# Patient Record
Sex: Male | Born: 1940 | Race: White | Hispanic: No | Smoking: Never smoker
Health system: Southern US, Community
[De-identification: ages and names within clinical notes are randomized; demographics above are authoritative.]

## PROBLEM LIST (undated history)

## (undated) DIAGNOSIS — K4120 Bilateral femoral hernia, without obstruction or gangrene, not specified as recurrent: Secondary | ICD-10-CM

## (undated) DIAGNOSIS — I714 Abdominal aortic aneurysm, without rupture, unspecified: Secondary | ICD-10-CM

## (undated) DIAGNOSIS — I10 Essential (primary) hypertension: Secondary | ICD-10-CM

## (undated) DIAGNOSIS — E7800 Pure hypercholesterolemia, unspecified: Secondary | ICD-10-CM

## (undated) HISTORY — DX: Essential (primary) hypertension: I10

## (undated) HISTORY — PX: INGUINAL HERNIA REPAIR: SUR1180

## (undated) HISTORY — DX: Bilateral femoral hernia, without obstruction or gangrene, not specified as recurrent: K41.20

## (undated) HISTORY — PX: COLONOSCOPY W/ POLYPECTOMY: SHX1380

## (undated) HISTORY — PX: COLONOSCOPY: SHX174

---

## 2003-09-29 ENCOUNTER — Encounter (INDEPENDENT_AMBULATORY_CARE_PROVIDER_SITE_OTHER): Payer: Self-pay | Admitting: *Deleted

## 2003-09-29 ENCOUNTER — Ambulatory Visit (HOSPITAL_COMMUNITY): Admission: RE | Admit: 2003-09-29 | Discharge: 2003-09-29 | Payer: Self-pay | Admitting: Gastroenterology

## 2006-05-20 ENCOUNTER — Ambulatory Visit: Payer: Self-pay | Admitting: Family Medicine

## 2006-05-21 ENCOUNTER — Ambulatory Visit: Payer: Self-pay | Admitting: Family Medicine

## 2007-03-22 LAB — HM COLONOSCOPY

## 2007-07-19 ENCOUNTER — Ambulatory Visit: Payer: Self-pay | Admitting: Family Medicine

## 2007-07-22 ENCOUNTER — Ambulatory Visit: Payer: Self-pay | Admitting: Family Medicine

## 2007-07-30 ENCOUNTER — Ambulatory Visit: Payer: Self-pay | Admitting: Family Medicine

## 2008-02-02 ENCOUNTER — Ambulatory Visit: Payer: Self-pay | Admitting: Family Medicine

## 2008-05-17 ENCOUNTER — Ambulatory Visit: Payer: Self-pay | Admitting: Family Medicine

## 2009-05-18 ENCOUNTER — Ambulatory Visit: Payer: Self-pay | Admitting: Family Medicine

## 2010-02-28 ENCOUNTER — Ambulatory Visit: Payer: Self-pay | Admitting: Family Medicine

## 2010-09-20 NOTE — Op Note (Signed)
Jacob Bowen, Jacob Bowen                          ACCOUNT NO.:  [id number]   MEDICAL RECORD NO.:  [id number]                   PATIENT TYPE:  AMB   LOCATION:  ENDO                                 FACILITY:  [facility]   PHYSICIAN:  John C. Madilyn Fireman, M.D.                 DATE OF BIRTH:  07/22/1940   DATE OF PROCEDURE:  09/29/2003  DATE OF DISCHARGE:                                 OPERATIVE REPORT   PROCEDURE:  Colonoscopy with polypectomy.   INDICATIONS FOR PROCEDURE:  Polyp seen on sigmoidoscopy.   PROCEDURE:  The patient was placed in the left lateral decubitus position  and placed on the pulse monitor with continuous low flow oxygen delivered by  nasal cannula.  He was sedated with 75 mcg IV fentanyl and 9 mg IV Versed.  The Olympus video colonoscope is inserted into the rectum and advanced to  the cecum, confirmed by transillumination at McBurney's point and  visualization of the ileocecal valve and appendiceal orifice.  Prep is good.  The cecum and ascending colon appeared normal with no masses, polyps,  diverticula, or other mucosal abnormalities.  Within the transverse colon,  there were three widely scattered polyps, 8-10 mm in diameter, and these  were all removed by snare.  In the descending sigmoid colon, there were  three polyps, approximately 6-10 mm in diameter, ranging from 45 cm to 22 cm  from the anal verge.  These were removed by a combination of snare and hot  biopsies.  A few scattered diverticula were seen in the sigmoid and  descending colon.  The rectum appeared normal, and retroflexed view of the  anus revealed no obvious internal hemorrhoids.  The colonoscope was then  withdrawn, and the patient returned to the recovery room in stable  condition.  He tolerated the procedure well, and there were no immediate  complications.   IMPRESSION:  1. Transverse, descending,and sigmoid colon polyps.  2. Mild diverticulosis.   PLAN:  Await histology to determine method in  interval for future colon  screening.                                               John C. Madilyn Fireman, M.D.    [facility]/MEDQ  D:  09/29/2003  T:  09/30/2003  Job:  [id number]   cc:   Sharlot Gowda, M.D.  [address]  [place], [place] [address]  Fax: [phone]

## 2011-01-22 ENCOUNTER — Encounter: Payer: Self-pay | Admitting: Family Medicine

## 2011-04-02 ENCOUNTER — Other Ambulatory Visit: Payer: Self-pay | Admitting: Family Medicine

## 2011-04-02 NOTE — Telephone Encounter (Signed)
Is this okay?

## 2011-04-03 ENCOUNTER — Telehealth: Payer: Self-pay | Admitting: Family Medicine

## 2011-04-03 MED ORDER — DOXAZOSIN MESYLATE 2 MG PO TABS: 2.0000 mg | ORAL_TABLET | Freq: Every day | ORAL | Status: DC

## 2011-04-03 NOTE — Telephone Encounter (Signed)
Has appt next week, #30 only

## 2011-04-03 NOTE — Telephone Encounter (Signed)
Needs an appt

## 2011-04-04 NOTE — Telephone Encounter (Signed)
Have tried twice to call pt to schedule an appt but is not able to reach or leave a message

## 2011-04-07 NOTE — Telephone Encounter (Signed)
Has a appt Friday. Will discuss it then

## 2011-04-11 ENCOUNTER — Encounter: Payer: Self-pay | Admitting: Family Medicine

## 2011-04-11 ENCOUNTER — Ambulatory Visit (INDEPENDENT_AMBULATORY_CARE_PROVIDER_SITE_OTHER): Payer: Medicare Other | Admitting: Family Medicine

## 2011-04-11 VITALS — BP 124/76 | Ht 75.0 in | Wt 202.0 lb

## 2011-04-11 DIAGNOSIS — I10 Essential (primary) hypertension: Secondary | ICD-10-CM

## 2011-04-11 DIAGNOSIS — Z23 Encounter for immunization: Secondary | ICD-10-CM

## 2011-04-11 DIAGNOSIS — K649 Unspecified hemorrhoids: Secondary | ICD-10-CM

## 2011-04-11 DIAGNOSIS — R972 Elevated prostate specific antigen [PSA]: Secondary | ICD-10-CM

## 2011-04-11 DIAGNOSIS — R195 Other fecal abnormalities: Secondary | ICD-10-CM

## 2011-04-11 LAB — COMPREHENSIVE METABOLIC PANEL
ALT: 9 U/L (ref 0–53)
AST: 14 U/L (ref 0–37)
Alkaline Phosphatase: 87 U/L (ref 39–117)
BUN: 10 mg/dL (ref 6–23)
Creat: 0.82 mg/dL (ref 0.50–1.35)
Total Bilirubin: 0.3 mg/dL (ref 0.3–1.2)

## 2011-04-11 LAB — CBC WITH DIFFERENTIAL/PLATELET
Basophils Absolute: 0 10*3/uL (ref 0.0–0.1)
Basophils Relative: 0 % (ref 0–1)
Eosinophils Absolute: 0.2 10*3/uL (ref 0.0–0.7)
Eosinophils Relative: 3 % (ref 0–5)
Lymphocytes Relative: 33 % (ref 12–46)
MCH: 31 pg (ref 26.0–34.0)
MCHC: 33.9 g/dL (ref 30.0–36.0)
MCV: 91.7 fL (ref 78.0–100.0)
Monocytes Absolute: 0.7 10*3/uL (ref 0.1–1.0)
Platelets: 228 10*3/uL (ref 150–400)
RDW: 13.3 % (ref 11.5–15.5)
WBC: 6.7 10*3/uL (ref 4.0–10.5)

## 2011-04-11 LAB — PSA, TOTAL AND FREE: PSA, Free Pct: 15 % — ABNORMAL LOW (ref >25)

## 2011-04-11 MED ORDER — DOXAZOSIN MESYLATE 4 MG PO TABS: 4.0000 mg | ORAL_TABLET | ORAL | Status: DC

## 2011-04-11 MED ORDER — LISINOPRIL-HYDROCHLOROTHIAZIDE 10-12.5 MG PO TABS: 1.0000 | ORAL_TABLET | Freq: Every day | ORAL | Status: DC

## 2011-04-11 NOTE — Patient Instructions (Signed)
Recheck in one month for recheck on blood pressure

## 2011-04-11 NOTE — Progress Notes (Signed)
  Subjective:    Patient ID: Jacob Bowen, male    DOB: 1940-12-25, 70 y.o.   MRN: [id number]  HPI He is here for an interval evaluation. He is getting ready to switch to for plans next year and would like to be switched to a different blood pressure medication it would be less expensive. He also needs followup on his elevated PSA. He has a history of hemorrhoids as well as adenomatous colonic polyps. He is now retired and enjoying his retirement.   Review of Systems  Constitutional: Negative.   HENT: Negative.   Eyes: Negative.   Respiratory: Negative.   Gastrointestinal: Negative.   Skin: Negative.   Psychiatric/Behavioral: Negative.        Objective:   Physical Exam BP 124/76  Ht 6\' 3"  (1.905 m)  Wt 202 lb (91.627 kg)  BMI 25.25 kg/m2  General Appearance:    Alert, cooperative, no distress, appears stated age  Head:    Normocephalic, without obvious abnormality, atraumatic  Eyes:    PERRL, conjunctiva/corneas clear, EOM's intact, fundi    benign  Ears:    Normal TM's and external ear canals  Nose:   Nares normal, mucosa normal, no drainage or sinus   tenderness  Throat:   Lips, mucosa, and tongue normal; teeth and gums normal  Neck:   Supple, no lymphadenopathy;  thyroid:  no   enlargement/tenderness/nodules; no carotid   bruit or JVD  Back:    Spine nontender, no curvature, ROM normal, no CVA     tenderness  Lungs:     Clear to auscultation bilaterally without wheezes, rales or     ronchi; respirations unlabored  Chest Wall:    No tenderness or deformity   Heart:    Regular rate and rhythm, S1 and S2 normal, no murmur, rub   or gallop  Breast Exam:    No chest wall tenderness, masses or gynecomastia  Abdomen:     Soft, non-tender, nondistended, normoactive bowel sounds,    no masses, no hepatosplenomegaly  Genitalia:    Normal male external genitalia without lesions.  Testicles without masses.  No inguinal hernias.  Rectal:    Normal sphincter tone, external  hemorrhoids noted; guaiac positive stool.  Prostate smooth, no nodules, not enlarged.  Extremities:   No clubbing, cyanosis or edema  Pulses:   2+ and symmetric all extremities  Skin:   Skin color, texture, turgor normal, no rashes or lesions  Lymph nodes:   Cervical, supraclavicular, and axillary nodes normal  Neurologic:   CNII-XII intact, normal strength, sensation and gait; reflexes 2+ and symmetric throughout          Psych:   Normal mood, affect, hygiene and grooming.           Assessment & Plan:   1. Elevated PSA  PSA, total and free  2. Hypertension  CBC with Differential, Comprehensive metabolic panel  3. Hemorrhoids  Ambulatory referral to Gastroenterology  4. Guaiac positive stools  Ambulatory referral to Gastroenterology, CBC with Differential, Comprehensive metabolic panel, POCT Hemoccult (POC) Blood/Stool Test   I again discussed his elevated PSA in regard to risk for cancer and appropriate followup. He is comfortable with monitoring as we have been. Will also switch him to different antihypertensive medication and recheck this in one month. Flu Shot given with discussion of possible side effects.

## 2011-05-12 ENCOUNTER — Encounter: Payer: Self-pay | Admitting: Family Medicine

## 2011-05-12 ENCOUNTER — Ambulatory Visit (INDEPENDENT_AMBULATORY_CARE_PROVIDER_SITE_OTHER): Payer: Medicare Other | Admitting: Family Medicine

## 2011-05-12 VITALS — BP 128/72 | HR 82 | Ht 74.0 in | Wt 196.0 lb

## 2011-05-12 DIAGNOSIS — I10 Essential (primary) hypertension: Secondary | ICD-10-CM

## 2011-05-12 NOTE — Progress Notes (Signed)
  Subjective:    Patient ID: Jacob Bowen, male    DOB: 1941/03/23, 71 y.o.   MRN: [id number]  HPI He is here for a recheck on his blood pressure. On his last visit he was switched to a different hypertensive medication. He is having no difficulty with areas since his last visit he has also had cataract surgery and notes a great improvement in his visual acuity.   Review of Systems     Objective:   Physical Exam Alert and in no distress. Blood pressure is recorded       Assessment & Plan:  Hypertension. Continue present medication regimen.

## 2011-05-12 NOTE — Patient Instructions (Signed)
Stay on the present meds

## 2011-05-29 ENCOUNTER — Other Ambulatory Visit: Payer: Self-pay | Admitting: Family Medicine

## 2011-11-21 ENCOUNTER — Ambulatory Visit (INDEPENDENT_AMBULATORY_CARE_PROVIDER_SITE_OTHER): Payer: Medicare Other | Admitting: Family Medicine

## 2011-11-21 ENCOUNTER — Telehealth: Payer: Self-pay

## 2011-11-21 ENCOUNTER — Encounter: Payer: Self-pay | Admitting: Family Medicine

## 2011-11-21 VITALS — BP 124/70 | HR 91 | Wt 189.0 lb

## 2011-11-21 DIAGNOSIS — K4090 Unilateral inguinal hernia, without obstruction or gangrene, not specified as recurrent: Secondary | ICD-10-CM

## 2011-11-21 NOTE — Progress Notes (Signed)
  Subjective:    Patient ID: Jacob Bowen, male    DOB: 1940/12/13, 71 y.o.   MRN: [id number]  HPI Several months ago while lifting something he felt a pain and swelling in the right inguinal area. He is here for evaluation.   Review of Systems     Objective:   Physical Exam Exam of the right inguinal area does show a rather large hernia present.       Assessment & Plan:   1. Right inguinal hernia  Ambulatory referral to General Surgery

## 2011-11-21 NOTE — Telephone Encounter (Signed)
Pt informed of appointment Aug,20 @ 9;45 [place] [facility] Dr.Gross [phone]

## 2011-12-23 ENCOUNTER — Ambulatory Visit (INDEPENDENT_AMBULATORY_CARE_PROVIDER_SITE_OTHER): Payer: Medicare Other | Admitting: Surgery

## 2011-12-23 ENCOUNTER — Encounter (INDEPENDENT_AMBULATORY_CARE_PROVIDER_SITE_OTHER): Payer: Self-pay | Admitting: Surgery

## 2011-12-23 VITALS — BP 128/70 | HR 92 | Temp 97.8°F | Resp 16 | Ht 75.0 in | Wt 191.0 lb

## 2011-12-23 DIAGNOSIS — K4090 Unilateral inguinal hernia, without obstruction or gangrene, not specified as recurrent: Secondary | ICD-10-CM

## 2011-12-23 DIAGNOSIS — K429 Umbilical hernia without obstruction or gangrene: Secondary | ICD-10-CM

## 2011-12-23 NOTE — Progress Notes (Signed)
Subjective:     Patient ID: Jacob Bowen, male   DOB: Mar 19, 1941, 71 y.o.   MRN: [id number]  HPI  Jacob Bowen  1941-02-21 [id number]  Patient Care Team: Ronnald Nian, MD as PCP - General (Family Medicine)  This patient is a 71 y.o.male who presents today for surgical evaluation at the request of Dr. Susann Givens.   Reason for evaluation: Right groin swelling and probable hernia.  Pleasant active male..  Busy working on cars and racing.  Noted in February a lump after working in the garage.  It is gotten larger.  He was concerned.  He saw his primary care physician who recommended consider of surgical repair for probable hernia.  He's usually rather active.  No prior history of any surgeries.  No skin problems.  He also notes that when he bends over he feels, pain and discomfort at times along his bilateral subcostal ridges.  Not worse with activity.  No radiation to his jaw nor arm.  No diaphoesis nor sweating.  Not associated with heavy activities except when he bends over.  No heartburn or reflux.  No belching.  He wondered what that meant.  Patient Active Problem List  Diagnosis  . Elevated PSA  . Hypertension  . Hemorrhoids  . Inguinal hernia, right, scrotal  . Umbilical hernia, 7mm  . Left groin swelling, possible hernia  . Costochondral chest pain, mild    Past Medical History  Diagnosis Date  . Hypertension   . Hemorrhoids     EXTERNAL    Past Surgical History  Procedure Date  . Colonoscopy   . Colonoscopy w/ polypectomy     History   Social History  . Marital Status: Divorced    Spouse Name: N/A    Number of Children: N/A  . Years of Education: N/A   Occupational History  . Not on file.   Social History Main Topics  . Smoking status: Never Smoker   . Smokeless tobacco: Never Used  . Alcohol Use: No  . Drug Use: No  . Sexually Active: Not on file   Other Topics Concern  . Not on file   Social History Narrative  . No narrative on file     Family History  Problem Relation Age of Onset  . Hypertension Father     Current Outpatient Prescriptions  Medication Sig Dispense Refill  . doxazosin (CARDURA) 4 MG tablet TAKE 1 TABLET EVERY DAY  30 tablet  5  . lisinopril-hydrochlorothiazide (ZESTORETIC) 10-12.5 MG per tablet Take 1 tablet by mouth daily.  90 tablet  3     No Known Allergies  BP 128/70  Pulse 92  Temp 97.8 F (36.6 C) (Temporal)  Resp 16  Ht 6\' 3"  (1.905 m)  Wt 191 lb (86.637 kg)  BMI 23.87 kg/m2  No results found.    Review of Systems  Constitutional: Negative for fever, chills and diaphoresis.  HENT: Negative for nosebleeds, sore throat, facial swelling, mouth sores, trouble swallowing and ear discharge.   Eyes: Negative for photophobia, discharge and visual disturbance.  Respiratory: Negative for choking, chest tightness, shortness of breath and stridor.   Cardiovascular: Positive for chest pain. Negative for palpitations.       Patient walks 3 miles and bicycles 10 miles without difficulty.  No exertional chest/neck/shoulder/arm pain.  Mild subcostal chest wall soreness with leaning forward   Gastrointestinal: Negative for nausea, vomiting, abdominal pain, diarrhea, constipation, blood in stool, abdominal distention, anal bleeding and rectal pain.  Genitourinary: Positive for difficulty urinating. Negative for dysuria, urgency, flank pain, enuresis and testicular pain.       BPH controlled w cardura  Musculoskeletal: Negative for myalgias, back pain, arthralgias and gait problem.  Skin: Negative for color change, pallor, rash and wound.  Neurological: Negative for dizziness, speech difficulty, weakness, numbness and headaches.  Hematological: Negative for adenopathy. Does not bruise/bleed easily.  Psychiatric/Behavioral: Negative for hallucinations, confusion and agitation.       Objective:   Physical Exam  Constitutional: He is oriented to person, place, and time. He appears  well-developed and well-nourished. No distress.  HENT:  Head: Normocephalic.  Mouth/Throat: Oropharynx is clear and moist. No oropharyngeal exudate.  Eyes: Conjunctivae and EOM are normal. Pupils are equal, round, and reactive to light. No scleral icterus.  Neck: Normal range of motion. Neck supple. No tracheal deviation present.  Cardiovascular: Normal rate, regular rhythm and intact distal pulses.   Pulmonary/Chest: Effort normal and breath sounds normal. No respiratory distress.  Abdominal: Soft. He exhibits no distension. There is no tenderness. A hernia is present. Hernia confirmed positive in the right inguinal area.  Genitourinary:     Musculoskeletal: Normal range of motion. He exhibits no tenderness.  Lymphadenopathy:    He has no cervical adenopathy.       Right: No inguinal adenopathy present.       Left: No inguinal adenopathy present.  Neurological: He is alert and oriented to person, place, and time. No cranial nerve deficit. He exhibits normal muscle tone. Coordination normal.  Skin: Skin is warm and dry. No rash noted. He is not diaphoretic. No erythema. No pallor.  Psychiatric: He has a normal mood and affect. His behavior is normal. Judgment and thought content normal. His mood appears not anxious. His affect is not labile. His speech is not rapid and/or pressured and not slurred.       Pleasant.  Very chatty & self-revealatory       Assessment:     RIH down to proximal scrotum with prob small LIH.  Small umb hernia  Probable mild costochondral irritation with activity.    Plan:     Lap RIH & prob LIH repairs.  Primary umb hernia repair:  The anatomy & physiology of the abdominal wall and pelvic floor was discussed.  The pathophysiology of hernias in the inguinal and pelvic region was discussed.  Natural history risks such as progressive enlargement, pain, incarceration & strangulation was discussed.   Contributors to complications such as smoking, obesity,  diabetes, prior surgery, etc were discussed.    I feel the risks of no intervention will lead to serious problems that outweigh the operative risks; therefore, I recommended surgery to reduce and repair the hernia.  I explained laparoscopic techniques with possible need for an open approach.  I noted usual use of mesh to patch and/or buttress hernia repair  Risks such as bleeding, infection, abscess, need for further treatment, heart attack, death, and other risks were discussed.  I noted a good likelihood this will help address the problem.   Goals of post-operative recovery were discussed as well.  Possibility that this will not correct all symptoms was explained.  I stressed the importance of low-impact activity, aggressive pain control, avoiding constipation, & not pushing through pain to minimize risk of post-operative chronic pain or injury. Possibility of reherniation was discussed.  We will work to minimize complications.     An educational handout further explaining the pathology & treatment options was given  as well.  Questions were answered.  The patient expresses understanding & wishes to proceed with surgery.  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  He is hoping to have surgery in early November when he returns.  Consider heat and anti-inflammatories for his lower chest wall / costochondral discomfort.  I suspect it resolve over the next month or so.  It is already happening less on its own

## 2011-12-23 NOTE — Patient Instructions (Addendum)
See the Handout(s) we gave you.  Consider surgery.  Please call our office at [phone] if you wish to schedule surgery or if you have further questions / concerns.    Hernia A hernia occurs when an internal organ pushes out through a weak spot in the abdominal wall. Hernias most commonly occur in the groin and around the navel. Hernias often can be pushed back into place (reduced). Most hernias tend to get worse over time. Some abdominal hernias can get stuck in the opening (irreducible or incarcerated hernia) and cannot be reduced. An irreducible abdominal hernia which is tightly squeezed into the opening is at risk for impaired blood supply (strangulated hernia). A strangulated hernia is a medical emergency. Because of the risk for an irreducible or strangulated hernia, surgery may be recommended to repair a hernia. CAUSES   Heavy lifting.   Prolonged coughing.   Straining to have a bowel movement.   A cut (incision) made during an abdominal surgery.  HOME CARE INSTRUCTIONS   Bed rest is not required. You may continue your normal activities.   Avoid lifting more than 10 pounds (4.5 kg) or straining.   Cough gently. If you are a smoker it is best to stop. Even the best hernia repair can break down with the continual strain of coughing. Even if you do not have your hernia repaired, a cough will continue to aggravate the problem.   Do not wear anything tight over your hernia. Do not try to keep it in with an outside bandage or truss. These can damage abdominal contents if they are trapped within the hernia sac.   Eat a normal diet.   Avoid constipation. Straining over long periods of time will increase hernia size and encourage breakdown of repairs. If you cannot do this with diet alone, stool softeners may be used.  SEEK IMMEDIATE MEDICAL CARE IF:   You have a fever.   You develop increasing abdominal pain.   You feel nauseous or vomit.   Your hernia is stuck outside the  abdomen, looks discolored, feels hard, or is tender.   You have any changes in your bowel habits or in the hernia that are unusual for you.   You have increased pain or swelling around the hernia.   You cannot push the hernia back in place by applying gentle pressure while lying down.  MAKE SURE YOU:   Understand these instructions.   Will watch your condition.   Will get help right away if you are not doing well or get worse.  Document Released: 04/21/2005 Document Revised: 04/10/2011 Document Reviewed: 12/09/2007 [employer] Patient Information 2012 [employer]. 

## 2012-01-27 ENCOUNTER — Encounter: Payer: Self-pay | Admitting: Medical

## 2012-01-27 ENCOUNTER — Ambulatory Visit (INDEPENDENT_AMBULATORY_CARE_PROVIDER_SITE_OTHER): Payer: Medicare Other | Admitting: Medical

## 2012-01-27 VITALS — BP 112/70 | HR 100 | Temp 98.1°F | Resp 16 | Wt 186.0 lb

## 2012-01-27 DIAGNOSIS — S62609B Fracture of unspecified phalanx of unspecified finger, initial encounter for open fracture: Secondary | ICD-10-CM

## 2012-01-27 DIAGNOSIS — S6710XA Crushing injury of unspecified finger(s), initial encounter: Secondary | ICD-10-CM

## 2012-01-27 DIAGNOSIS — IMO0002 Reserved for concepts with insufficient information to code with codable children: Secondary | ICD-10-CM

## 2012-01-27 DIAGNOSIS — IMO0001 Reserved for inherently not codable concepts without codable children: Secondary | ICD-10-CM

## 2012-01-27 NOTE — Progress Notes (Signed)
Subjective: Here for emergency dept follow up.  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  They were attempting to move his race car when his hand got caught in the tow strap crushing his right middle and ring finger.  The middle finger tip pad was torn off and lying in his race glove, and there were open wounds.  He went to the ED where orthopedic surgeon did repair of the fingers under digital block local anesthesia.  He apparently was able to close the ringer finger wound, but ended up pulling skin flap up from lower on middle finger to repair amputated tip of the middle right finger.  He was given pain medication and antibiotics in the ED, and was discharged with hand wrapped in bandages.   He was advised to f/u with PCM and ortho within a week, but to change dressing if bloody or soiled drainage.   He is here today as the bandage is becoming quite soiled with bloody drainage from the wound.  He has not changed the dressing since leaving the ED.    He currently denies fever, nausea, numbness, tingling, weakness.  He wants ortho referral to possibly save the fingernail or as much of the right middle finger tip as possible.  He also needs the bandage changed.  No other c/o.   Past Medical History  Diagnosis Date  . Hypertension   . Hemorrhoids     EXTERNAL   ROS as noted above in HPI  Objective: Gen: wd, wn MSK: right hand and forearm wrapped in ACE wrap and bandaged up with obvious metal finger splint over right 4th finger, and there is bloody soiling of the bandage at distal middle finger.  Skin: warm  Assessment: Encounter Diagnoses  Name Primary?  . Crushing injury of finger Yes  . Amputation of finger tip   . Open fracture of phalanx of fourth finger of right hand     Plan: I reviewed the ED records that were provided by the patient including xray.  Given the need for bandage change and possibility of additional procedure to salvage  distal middle finger, [facility] for further evaluation and management.

## 2012-03-18 DIAGNOSIS — K4020 Bilateral inguinal hernia, without obstruction or gangrene, not specified as recurrent: Secondary | ICD-10-CM

## 2012-03-18 DIAGNOSIS — Q828 Other specified congenital malformations of skin: Secondary | ICD-10-CM

## 2012-04-07 ENCOUNTER — Telehealth (INDEPENDENT_AMBULATORY_CARE_PROVIDER_SITE_OTHER): Payer: Self-pay | Admitting: General Surgery

## 2012-04-07 ENCOUNTER — Encounter (INDEPENDENT_AMBULATORY_CARE_PROVIDER_SITE_OTHER): Payer: Medicare Other | Admitting: Surgery

## 2012-04-07 NOTE — Telephone Encounter (Signed)
Tried leaving a message to let pt know hat I am having to reschedule his appt to 12/10 at 3:00 due to Dr. Michaell Cowing not being able to make I to the clinic today.  The patient had no voicemail option available so I sent his new appt time information in the mail.

## 2012-04-13 ENCOUNTER — Encounter (INDEPENDENT_AMBULATORY_CARE_PROVIDER_SITE_OTHER): Payer: Self-pay | Admitting: Surgery

## 2012-04-13 ENCOUNTER — Ambulatory Visit (INDEPENDENT_AMBULATORY_CARE_PROVIDER_SITE_OTHER): Payer: Medicare Other | Admitting: Surgery

## 2012-04-13 VITALS — BP 140/72 | HR 88 | Temp 96.8°F | Resp 18 | Ht 75.0 in | Wt 192.0 lb

## 2012-04-13 DIAGNOSIS — K4090 Unilateral inguinal hernia, without obstruction or gangrene, not specified as recurrent: Secondary | ICD-10-CM

## 2012-04-13 DIAGNOSIS — K429 Umbilical hernia without obstruction or gangrene: Secondary | ICD-10-CM

## 2012-04-13 DIAGNOSIS — K4120 Bilateral femoral hernia, without obstruction or gangrene, not specified as recurrent: Secondary | ICD-10-CM

## 2012-04-13 HISTORY — DX: Bilateral femoral hernia, without obstruction or gangrene, not specified as recurrent: K41.20

## 2012-04-13 NOTE — Progress Notes (Signed)
Subjective:     Patient ID: Jacob Bowen, male   DOB: [birth year], 71 y.o.   MRN: [id number]  HPI  DACE DENN  November 03, 1940 [id number]  Patient Care Team: Ronnald Nian, MD as PCP - General (Family Medicine)  This patient is a 71 y.o.male who presents today for surgical evaluation.   Is an active male with inguinal hernia direct scrotum probable left inguinal hernia.  I took him to the operating room.  He actually had bilateral femoral hernia as well.  All repair with mesh.  Small umbilical hernia primarily repaired.  Use ice packs for two days and then stop.  Start moving his bowels postop day three.  He has done well.  Walking well.  Hoping to back to cycling more aggressively.  No fevers or chills.  Never needed narcotics.  Patient Active Problem List  Diagnosis  . Elevated PSA  . Hypertension  . Hemorrhoids    Past Medical History  Diagnosis Date  . Hypertension   . Hemorrhoids     EXTERNAL  . Femoral hernia, bilateral, s/p lap repair 04/13/2012    Past Surgical History  Procedure Date  . Colonoscopy   . Colonoscopy w/ polypectomy     History   Social History  . Marital Status: Divorced    Spouse Name: N/A    Number of Children: N/A  . Years of Education: N/A   Occupational History  . Not on file.   Social History Main Topics  . Smoking status: Never Smoker   . Smokeless tobacco: Never Used  . Alcohol Use: No  . Drug Use: No  . Sexually Active: Not on file   Other Topics Concern  . 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 Hypertension Father     Current Outpatient Prescriptions  Medication Sig Dispense Refill  . doxazosin (CARDURA) 4 MG tablet TAKE 1 TABLET EVERY DAY  30 tablet  5  . lisinopril-hydrochlorothiazide (PRINZIDE,ZESTORETIC) 10-12.5 MG per tablet Take 1 tablet by mouth daily.        Marland Kitchen HYDROcodone-acetaminophen (NORCO) 7.5-325 MG per tablet Take 1 tablet by mouth every 6 (six) hours as needed.      Marland Kitchen lisinopril-hydrochlorothiazide (ZESTORETIC) 10-12.5 MG per tablet Take 1 tablet by mouth daily.  90 tablet  3     No Known Allergies  BP 140/72  Pulse 88  Temp 96.8 F (36 C) (Temporal)  Resp 18  Ht 6\' 3"  (1.905 m)  Wt 192 lb (87.091 kg)  BMI 24.00 kg/m2  No results found.   Review of Systems  Constitutional: Negative for fever, chills and diaphoresis.  HENT: Negative for sore throat, trouble swallowing and neck pain.   Eyes: Negative for photophobia and visual disturbance.  Respiratory: Negative for choking and shortness of breath.   Cardiovascular: Negative for chest pain and palpitations.  Gastrointestinal: Negative for nausea, vomiting, abdominal distention, anal bleeding and rectal pain.  Genitourinary: Negative for dysuria, urgency, difficulty urinating and testicular pain.  Musculoskeletal: Negative for myalgias, arthralgias and gait problem.  Skin: Negative for color change and rash.  Neurological: Negative for dizziness, speech difficulty, weakness and numbness.  Hematological: Negative for adenopathy.  Psychiatric/Behavioral: Negative for hallucinations, confusion and agitation.       Objective:   Physical Exam  Constitutional: He  is oriented to person, place, and time. He appears well-developed and well-nourished. No distress.  HENT:  Head: Normocephalic.  Mouth/Throat: Oropharynx is clear and moist. No oropharyngeal exudate.  Eyes: Conjunctivae normal and EOM are normal. Pupils are equal, round, and reactive to light. No scleral icterus.  Neck: Normal range of motion. No tracheal deviation present.  Cardiovascular: Normal rate, normal heart sounds and intact distal pulses.   Pulmonary/Chest: Effort normal. No respiratory distress.  Abdominal: Soft. He exhibits no distension. There is no tenderness. No hernia. Hernia confirmed negative in the  right inguinal area and confirmed negative in the left inguinal area.         Incisions clean with normal healing ridges.  No hernias  Musculoskeletal: Normal range of motion. He exhibits no tenderness.  Neurological: He is alert and oriented to person, place, and time. No cranial nerve deficit. He exhibits normal muscle tone. Coordination normal.  Skin: Skin is warm and dry. No rash noted. He is not diaphoretic.  Psychiatric: He has a normal mood and affect. His behavior is normal.       Assessment:     1 month Status post-laparoscopic repair of bilateral inguinal and femoral hernias and umbilical hernias.  Recovering remarkably well.    Plan:     Increase activity as tolerated to regular activity.  Do not push through pain.  Okay to do more aggressive activities such as bike riding  Diet as tolerated. Bowel regimen to avoid problems.  Return to clinic p.r.n.   Instructions discussed.  Followup with primary care physician for other health issues as would normally be done.  Questions answered.  The patient expressed understanding and appreciation

## 2012-04-13 NOTE — Patient Instructions (Signed)
HERNIA REPAIR: POST OP INSTRUCTIONS  1. DIET: Follow a light bland diet the first 24 hours after arrival home, such as soup, liquids, crackers, etc.  Be sure to include lots of fluids daily.  Avoid fast food or heavy meals as your are more likely to get nauseated.  Eat a low fat the next few days after surgery. 2. Take your usually prescribed home medications unless otherwise directed. 3. PAIN CONTROL: a. Pain is best controlled by a usual combination of three different methods TOGETHER: i. Ice/Heat ii. Over the counter pain medication iii. Prescription pain medication b. Most patients will experience some swelling and bruising around the hernia(s) such as the bellybutton, groins, or old incisions.  Ice packs or heating pads (30-60 minutes up to 6 times a day) will help. Use ice for the first few days to help decrease swelling and bruising, then switch to heat to help relax tight/sore spots and speed recovery.  Some people prefer to use ice alone, heat alone, alternating between ice & heat.  Experiment to what works for you.  Swelling and bruising can take several weeks to resolve.   c. It is helpful to take an over-the-counter pain medication regularly for the first few weeks.  Choose one of the following that works best for you: i. Naproxen (Aleve, etc)  Two 220mg tabs twice a day ii. Ibuprofen (Advil, etc) Three 200mg tabs four times a day (every meal & bedtime) iii. Acetaminophen (Tylenol, etc) 325-650mg four times a day (every meal & bedtime) d. A  prescription for pain medication should be given to you upon discharge.  Take your pain medication as prescribed.  i. If you are having problems/concerns with the prescription medicine (does not control pain, nausea, vomiting, rash, itching, etc), please call us [phone] to see if we need to switch you to a different pain medicine that will work better for you and/or control your side effect better. ii. If you need a refill on your pain  medication, please contact your pharmacy.  They will contact our office to request authorization. Prescriptions will not be filled after 5 pm or on week-ends. 4. Avoid getting constipated.  Between the surgery and the pain medications, it is common to experience some constipation.  Increasing fluid intake and taking a fiber supplement (such as Metamucil, Citrucel, FiberCon, MiraLax, etc) 1-2 times a day regularly will usually help prevent this problem from occurring.  A mild laxative (prune juice, Milk of Magnesia, MiraLax, etc) should be taken according to package directions if there are no bowel movements after 48 hours.   5. Wash / shower every day.  You may shower over the dressings as they are waterproof.   6. Remove your waterproof bandages 5 days after surgery.  You may leave the incision open to air.  You may replace a dressing/Band-Aid to cover the incision for comfort if you wish.  Continue to shower over incision(s) after the dressing is off.    7. ACTIVITIES as tolerated:   a. You may resume regular (light) daily activities beginning the next day-such as daily self-care, walking, climbing stairs-gradually increasing activities as tolerated.  If you can walk 30 minutes without difficulty, it is safe to try more intense activity such as jogging, treadmill, bicycling, low-impact aerobics, swimming, etc. b. Save the most intensive and strenuous activity for last such as sit-ups, heavy lifting, contact sports, etc  Refrain from any heavy lifting or straining until you are off narcotics for pain control.     c. DO NOT PUSH THROUGH PAIN.  Let pain be your guide: If it hurts to do something, don't do it.  Pain is your body warning you to avoid that activity for another week until the pain goes down. d. You may drive when you are no longer taking prescription pain medication, you can comfortably wear a seatbelt, and you can safely maneuver your car and apply brakes. e. You may have sexual intercourse  when it is comfortable.  8. FOLLOW UP in our office a. Please call [facility] at [phone] to set up an appointment to see your surgeon in the office for a follow-up appointment approximately 2-3 weeks after your surgery. b. Make sure that you call for this appointment the day you arrive home to insure a convenient appointment time. 9.  IF YOU HAVE DISABILITY OR FAMILY LEAVE FORMS, BRING THEM TO THE OFFICE FOR PROCESSING.  DO NOT GIVE THEM TO YOUR DOCTOR.  WHEN TO CALL US [phone]: 1. Poor pain control 2. Reactions / problems with new medications (rash/itching, nausea, etc)  3. Fever over 101.5 F (38.5 C) 4. Inability to urinate 5. Nausea and/or vomiting 6. Worsening swelling or bruising 7. Continued bleeding from incision. 8. Increased pain, redness, or drainage from the incision   The clinic staff is available to answer your questions during regular business hours (8:30am-5pm).  Please don't hesitate to call and ask to speak to one of our nurses for clinical concerns.   If you have a medical emergency, go to the nearest emergency room or call 911.  A surgeon from [facility] is always on call at the hospitals in [place]  [facility] [address], [address], [place], [place]  [address] ?  [address] [address], [place], [place]   [address] MAIN: [phone] ? TOLL FREE: [phone] ? FAX: [phone] [email or web address]  

## 2012-04-14 ENCOUNTER — Other Ambulatory Visit: Payer: Self-pay | Admitting: Family Medicine

## 2012-05-03 ENCOUNTER — Other Ambulatory Visit: Payer: Self-pay | Admitting: Family Medicine

## 2012-05-03 NOTE — Telephone Encounter (Signed)
Patient needs to schedule a followup appointment.

## 2012-07-15 ENCOUNTER — Other Ambulatory Visit: Payer: Self-pay | Admitting: Family Medicine

## 2012-08-03 ENCOUNTER — Other Ambulatory Visit: Payer: Self-pay | Admitting: Family Medicine

## 2012-10-13 ENCOUNTER — Other Ambulatory Visit: Payer: Self-pay | Admitting: Family Medicine

## 2012-11-06 ENCOUNTER — Other Ambulatory Visit: Payer: Self-pay | Admitting: Family Medicine

## 2012-11-08 ENCOUNTER — Other Ambulatory Visit: Payer: Self-pay | Admitting: Family Medicine

## 2012-11-09 ENCOUNTER — Encounter: Payer: Self-pay | Admitting: Family Medicine

## 2012-11-09 ENCOUNTER — Ambulatory Visit (INDEPENDENT_AMBULATORY_CARE_PROVIDER_SITE_OTHER): Payer: Medicare Other | Admitting: Family Medicine

## 2012-11-09 VITALS — BP 122/64 | HR 114 | Ht 73.0 in | Wt 179.0 lb

## 2012-11-09 DIAGNOSIS — R1900 Intra-abdominal and pelvic swelling, mass and lump, unspecified site: Secondary | ICD-10-CM

## 2012-11-09 DIAGNOSIS — R972 Elevated prostate specific antigen [PSA]: Secondary | ICD-10-CM

## 2012-11-09 DIAGNOSIS — R000 Tachycardia, unspecified: Secondary | ICD-10-CM

## 2012-11-09 DIAGNOSIS — I10 Essential (primary) hypertension: Secondary | ICD-10-CM

## 2012-11-09 DIAGNOSIS — Z0000 Encounter for general adult medical examination without abnormal findings: Secondary | ICD-10-CM

## 2012-11-09 LAB — CBC WITH DIFFERENTIAL/PLATELET
Basophils Absolute: 0 10*3/uL (ref 0.0–0.1)
Eosinophils Relative: 2 % (ref 0–5)
Lymphocytes Relative: 26 % (ref 12–46)
Neutro Abs: 6 10*3/uL (ref 1.7–7.7)
Platelets: 239 10*3/uL (ref 150–400)
RDW: 13.8 % (ref 11.5–15.5)
WBC: 9.6 10*3/uL (ref 4.0–10.5)

## 2012-11-09 LAB — COMPREHENSIVE METABOLIC PANEL
ALT: 9 U/L (ref 0–53)
AST: 14 U/L (ref 0–37)
Alkaline Phosphatase: 88 U/L (ref 39–117)
Calcium: 9.8 mg/dL (ref 8.4–10.5)
Chloride: 101 mEq/L (ref 96–112)
Creat: 0.96 mg/dL (ref 0.50–1.35)
Potassium: 3.9 mEq/L (ref 3.5–5.3)

## 2012-11-09 LAB — POCT URINALYSIS DIPSTICK
Leukocytes, UA: NEGATIVE
Urobilinogen, UA: NEGATIVE

## 2012-11-09 LAB — LIPID PANEL
LDL Cholesterol: 150 mg/dL — ABNORMAL HIGH (ref 0–99)
Total CHOL/HDL Ratio: 5 Ratio

## 2012-11-09 LAB — HEMOCCULT GUIAC POC 1CARD (OFFICE)

## 2012-11-09 MED ORDER — DOXAZOSIN MESYLATE 4 MG PO TABS: ORAL_TABLET | ORAL | Status: DC

## 2012-11-09 MED ORDER — LISINOPRIL-HYDROCHLOROTHIAZIDE 10-12.5 MG PO TABS: ORAL_TABLET | ORAL | Status: DC

## 2012-11-09 NOTE — Progress Notes (Signed)
  Subjective:    Patient ID: Jacob Bowen, male    DOB: May 16, 1940, 72 y.o.   MRN: [id number]  HPI He is here for complete examination. He has no particular complaints or concerns. He did have an injury to his right hand third finger. 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 This seems to be healing well. Review of the record indicates he did have an elevated PSA in the past. He continues on his blood pressure medications and is not having any difficulty with them.social and family history were reviewed. 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  Review of Systems Negative except as above    Objective:   Physical Exam BP 122/64  Pulse 114  Ht 6\' 1"  (1.854 m)  Wt 179 lb (81.194 kg)  BMI 23.62 kg/m2  General Appearance:    Alert, cooperative, no distress, appears stated age  Head:    Normocephalic, without obvious abnormality, atraumatic  Eyes:    PERRL, conjunctiva/corneas clear, EOM's intact, fundi    benign  Ears:    Normal TM's and external ear canals  Nose:   Nares normal, mucosa normal, no drainage or sinus   tenderness  Throat:   Lips, mucosa, and tongue normal; teeth and gums normal  Neck:   Supple, no lymphadenopathy;  thyroid:  no   enlargement/tenderness/nodules; no carotid   bruit or JVD  Back:    Spine nontender, no curvature, ROM normal, no CVA     tenderness  Lungs:     Clear to auscultation bilaterally without wheezes, rales or     ronchi; respirations unlabored  Chest Wall:    No tenderness or deformity   Heart:    tachycardia noted, S1 and S2 normal, no murmur, rub   or gallop  Breast Exam:    No chest wall tenderness, masses or gynecomastia  Abdomen:     Soft, non-tender, nondistended, normoactive bowel sounds,    Pulsatile mass noted, no hepatosplenomegaly  Genitalia:  deferred  Rectal:    Normal sphincter tone, no masses or tenderness; guaiac negative  stool.  Prostate smooth, no nodules, not enlarged.  Extremities:   No clubbing, cyanosis or edema  Pulses:   2+ and symmetric all extremities  Skin:   Skin color, texture, turgor normal, no rashes or lesions  Lymph nodes:   Cervical, supraclavicular, and axillary nodes normal  Neurologic:   CNII-XII intact, normal strength, sensation and gait; reflexes 2+ and symmetric throughout          Psych:   Normal mood, affect, hygiene and grooming.    EKG does show sinus tachycardia. Review previous EKG indicates his rate was in the 90s. It makes me less concerned about his present cardiac rate.     Assessment & Plan:  Hypertension - Plan: POCT Urinalysis Dipstick, doxazosin (CARDURA) 4 MG tablet, lisinopril-hydrochlorothiazide (PRINZIDE,ZESTORETIC) 10-12.5 MG per tablet  Routine general medical examination at a health care facility - Plan: CBC with Differential, Comprehensive metabolic panel, Lipid panel  Elevated PSA - Plan: PSA, Medicare  Tachycardia - Plan: EKG 12-Lead  Pulsatile abdominal mass - Plan: US Aorta Initial Medicare Screen

## 2012-11-11 NOTE — Progress Notes (Signed)
Quick Note:  FAXED ALL INFO TO [facility] ______

## 2012-11-15 ENCOUNTER — Other Ambulatory Visit: Payer: Self-pay | Admitting: Family Medicine

## 2012-11-15 ENCOUNTER — Ambulatory Visit
Admission: RE | Admit: 2012-11-15 | Discharge: 2012-11-15 | Disposition: A | Discharge status: Home / Self Care | Payer: Medicare Other | Source: Ambulatory Visit | Attending: Family Medicine | Admitting: Family Medicine

## 2012-11-15 ENCOUNTER — Other Ambulatory Visit: Payer: Self-pay

## 2012-11-15 DIAGNOSIS — Z136 Encounter for screening for cardiovascular disorders: Secondary | ICD-10-CM

## 2012-11-15 DIAGNOSIS — R1900 Intra-abdominal and pelvic swelling, mass and lump, unspecified site: Secondary | ICD-10-CM

## 2012-11-15 NOTE — Progress Notes (Signed)
Quick Note:  I HAVE PUT ORDER IN ______

## 2013-01-12 ENCOUNTER — Encounter: Payer: Self-pay | Admitting: Family Medicine

## 2013-02-04 ENCOUNTER — Other Ambulatory Visit: Payer: Self-pay | Admitting: Family Medicine

## 2013-05-16 ENCOUNTER — Ambulatory Visit (INDEPENDENT_AMBULATORY_CARE_PROVIDER_SITE_OTHER): Payer: Medicare Other | Admitting: Family Medicine

## 2013-05-16 ENCOUNTER — Encounter: Payer: Self-pay | Admitting: Family Medicine

## 2013-05-16 VITALS — BP 122/80 | HR 107 | Ht 75.0 in | Wt 192.0 lb

## 2013-05-16 DIAGNOSIS — I10 Essential (primary) hypertension: Secondary | ICD-10-CM

## 2013-05-16 DIAGNOSIS — Z79899 Other long term (current) drug therapy: Secondary | ICD-10-CM

## 2013-05-16 NOTE — Progress Notes (Signed)
   Subjective:    Patient ID: Jacob SeniorDwight K Rhett, male    DOB: [birth year], 73 y.o.   MRN: [id number]  HPI He is here for blood pressure recheck. He continues on medications listed in the chart. He has had some difficulty with elevated PSA but panel his last numbers look good. He is scheduled for followup on this in March. 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   Review of Systems     Objective:   Physical Exam alert and in no distress. Tympanic membranes and canals are normal. Throat is clear. Tonsils are normal. Neck is supple without adenopathy or thyromegaly. Cardiac exam shows a regular sinus rhythm without murmurs or gallops. Lungs are clear to auscultation.        Assessment & Plan:  Hypertension  Encounter for long-term (current) use of other medications  encouraged him to followup with me on a regular basis. Explained that I can also do the PSA testing on him.

## 2014-01-17 ENCOUNTER — Other Ambulatory Visit: Payer: Self-pay | Admitting: Family Medicine

## 2014-02-07 ENCOUNTER — Other Ambulatory Visit: Payer: Self-pay | Admitting: Family Medicine

## 2014-02-07 NOTE — Telephone Encounter (Signed)
I HAVE TRIED CALLING PT BUT THERE IS NO ANSWER SO I HAVE REJECTED IT AND REASON PT NEEDS TO CONTACT OFFICE PT NEEDS MED CHECK OR CPE WHEN THIS IS MADE I WILL REFILL MED

## 2014-02-13 ENCOUNTER — Telehealth: Payer: Self-pay | Admitting: Family Medicine

## 2014-02-13 ENCOUNTER — Other Ambulatory Visit: Payer: Self-pay | Admitting: Family Medicine

## 2014-02-13 DIAGNOSIS — I10 Essential (primary) hypertension: Secondary | ICD-10-CM

## 2014-02-13 MED ORDER — DOXAZOSIN MESYLATE 4 MG PO TABS: ORAL_TABLET | ORAL | Status: DC

## 2014-02-13 NOTE — Telephone Encounter (Signed)
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 He would like to have this medication refilled until then. Pt needs Doxazosin sent to [facility].

## 2014-04-17 ENCOUNTER — Other Ambulatory Visit: Payer: Self-pay | Admitting: Family Medicine

## 2014-05-08 ENCOUNTER — Encounter: Payer: Self-pay | Admitting: Family Medicine

## 2014-05-08 ENCOUNTER — Ambulatory Visit (INDEPENDENT_AMBULATORY_CARE_PROVIDER_SITE_OTHER): Payer: Medicare Other | Admitting: Family Medicine

## 2014-05-08 VITALS — BP 116/70 | HR 87 | Resp 14 | Ht 73.0 in | Wt 182.0 lb

## 2014-05-08 DIAGNOSIS — K640 First degree hemorrhoids: Secondary | ICD-10-CM

## 2014-05-08 DIAGNOSIS — E785 Hyperlipidemia, unspecified: Secondary | ICD-10-CM | POA: Insufficient documentation

## 2014-05-08 DIAGNOSIS — I10 Essential (primary) hypertension: Secondary | ICD-10-CM

## 2014-05-08 DIAGNOSIS — Z23 Encounter for immunization: Secondary | ICD-10-CM | POA: Diagnosis not present

## 2014-05-08 DIAGNOSIS — R972 Elevated prostate specific antigen [PSA]: Secondary | ICD-10-CM

## 2014-05-08 DIAGNOSIS — Z0000 Encounter for general adult medical examination without abnormal findings: Secondary | ICD-10-CM | POA: Diagnosis not present

## 2014-05-08 LAB — CBC WITH DIFFERENTIAL/PLATELET
BASOS PCT: 0 % (ref 0–1)
Basophils Absolute: 0 10*3/uL (ref 0.0–0.1)
Eosinophils Absolute: 0.4 10*3/uL (ref 0.0–0.7)
Eosinophils Relative: 6 % — ABNORMAL HIGH (ref 0–5)
HEMATOCRIT: 40.9 % (ref 39.0–52.0)
Hemoglobin: 14.3 g/dL (ref 13.0–17.0)
Lymphocytes Relative: 40 % (ref 12–46)
Lymphs Abs: 2.8 10*3/uL (ref 0.7–4.0)
MCH: 30.8 pg (ref 26.0–34.0)
MCHC: 35 g/dL (ref 30.0–36.0)
MCV: 88 fL (ref 78.0–100.0)
MPV: 10.8 fL (ref 8.6–12.4)
Monocytes Absolute: 0.6 10*3/uL (ref 0.1–1.0)
Monocytes Relative: 9 % (ref 3–12)
Neutro Abs: 3.2 10*3/uL (ref 1.7–7.7)
Neutrophils Relative %: 45 % (ref 43–77)
Platelets: 219 10*3/uL (ref 150–400)
RBC: 4.65 MIL/uL (ref 4.22–5.81)
RDW: 13.5 % (ref 11.5–15.5)
WBC: 7.1 10*3/uL (ref 4.0–10.5)

## 2014-05-08 LAB — COMPREHENSIVE METABOLIC PANEL
ALT: 9 U/L (ref 0–53)
AST: 16 U/L (ref 0–37)
Albumin: 4.3 g/dL (ref 3.5–5.2)
Alkaline Phosphatase: 71 U/L (ref 39–117)
BUN: 15 mg/dL (ref 6–23)
CALCIUM: 9.5 mg/dL (ref 8.4–10.5)
CO2: 26 mEq/L (ref 19–32)
Chloride: 104 mEq/L (ref 96–112)
Creat: 0.82 mg/dL (ref 0.50–1.35)
Glucose, Bld: 102 mg/dL — ABNORMAL HIGH (ref 70–99)
Potassium: 3.5 mEq/L (ref 3.5–5.3)
Sodium: 137 mEq/L (ref 135–145)
TOTAL PROTEIN: 7.1 g/dL (ref 6.0–8.3)
Total Bilirubin: 0.5 mg/dL (ref 0.2–1.2)

## 2014-05-08 LAB — LIPID PANEL
Cholesterol: 200 mg/dL (ref 0–200)
HDL: 49 mg/dL (ref >39)
LDL Cholesterol: 115 mg/dL — ABNORMAL HIGH (ref 0–99)
Total CHOL/HDL Ratio: 4.1 Ratio
Triglycerides: 178 mg/dL — ABNORMAL HIGH (ref <150)
VLDL: 36 mg/dL (ref 0–40)

## 2014-05-08 MED ORDER — DOXAZOSIN MESYLATE 4 MG PO TABS: ORAL_TABLET | ORAL | Status: DC

## 2014-05-08 MED ORDER — LISINOPRIL-HYDROCHLOROTHIAZIDE 10-12.5 MG PO TABS: 1.0000 | ORAL_TABLET | Freq: Every day | ORAL | Status: DC

## 2014-05-08 NOTE — Progress Notes (Signed)
   Subjective:    Patient ID: Jacob Bowen, male    DOB: 1941-01-31, 74 y.o.   MRN: [id number]  HPI He is here for complete examination. He has enjoyed excellent health. He has no major concerns. He has a previous history of elevated PSA and had been evaluated by urology. His previous PSA was in the 4 range. 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 He continues on his blood pressure medications. He is now retired and keeps himself occupied. Family and social history were reviewed.   Review of Systems  All other systems reviewed and are negative.      Objective:   Physical Exam BP 116/70 mmHg  Pulse 87  Resp 14  Ht  (1.854 m)  Wt 182 lb (82.555 kg)  BMI 24.02 kg/m2  SpO2 98%  General Appearance:    Alert, cooperative, no distress, appears stated age  Head:    Normocephalic, without obvious abnormality, atraumatic  Eyes:    PERRL, conjunctiva/corneas clear, EOM's intact, fundi    benign  Ears:    Normal TM's and external ear canals  Nose:   Nares normal, mucosa normal, no drainage or sinus   tenderness  Throat:   Lips, mucosa, and tongue normal; teeth and gums normal  Neck:   Supple, no lymphadenopathy;  thyroid:  no   enlargement/tenderness/nodules; no carotid   bruit or JVD  Back:    Spine nontender, no curvature, ROM normal, no CVA     tenderness  Lungs:     Clear to auscultation bilaterally without wheezes, rales or     ronchi; respirations unlabored  Chest Wall:    No tenderness or deformity   Heart:    Regular rate and rhythm, S1 and S2 normal, no murmur, rub   or gallop  Breast Exam:    No chest wall tenderness, masses or gynecomastia  Abdomen:     Soft, non-tender, nondistended, normoactive bowel sounds,    no masses, no hepatosplenomegaly        Extremities:   No clubbing, cyanosis or edema  Pulses:   2+ and symmetric all extremities  Skin:   Skin color, texture, turgor normal, no rashes or lesions  Lymph nodes:   Cervical, supraclavicular,  and axillary nodes normal  Neurologic:   CNII-XII intact, normal strength, sensation and gait; reflexes 2+ and symmetric throughout          Psych:   Normal mood, affect, hygiene and grooming.          Assessment & Plan:  Essential hypertension - Plan: lisinopril-hydrochlorothiazide (PRINZIDE,ZESTORETIC) 10-12.5 MG per tablet, doxazosin (CARDURA) 4 MG tablet, CBC with Differential, Comprehensive metabolic panel  Elevated PSA - Plan: PSA  First degree hemorrhoids  Need for prophylactic vaccination against Streptococcus pneumoniae (pneumococcus) - Plan: Pneumococcal conjugate vaccine 13-valent  Need for prophylactic vaccination and inoculation against influenza - Plan: Flu vaccine HIGH DOSE PF (Fluzone Tri High dose)  Hyperlipidemia LDL goal <130 - Plan: Lipid panel  Routine general medical examination at a health care facility - Plan: lisinopril-hydrochlorothiazide (PRINZIDE,ZESTORETIC) 10-12.5 MG per tablet

## 2014-05-09 LAB — PSA: PSA: 5.65 ng/mL — ABNORMAL HIGH (ref <=4.00)

## 2014-11-10 DIAGNOSIS — H18411 Arcus senilis, right eye: Secondary | ICD-10-CM | POA: Diagnosis not present

## 2014-11-10 DIAGNOSIS — Z961 Presence of intraocular lens: Secondary | ICD-10-CM | POA: Diagnosis not present

## 2014-11-10 DIAGNOSIS — H18412 Arcus senilis, left eye: Secondary | ICD-10-CM | POA: Diagnosis not present

## 2015-05-21 ENCOUNTER — Ambulatory Visit (INDEPENDENT_AMBULATORY_CARE_PROVIDER_SITE_OTHER): Payer: Medicare Other | Admitting: Family Medicine

## 2015-05-21 ENCOUNTER — Encounter: Payer: Self-pay | Admitting: Family Medicine

## 2015-05-21 ENCOUNTER — Other Ambulatory Visit: Payer: Self-pay | Admitting: Family Medicine

## 2015-05-21 VITALS — BP 128/80 | HR 68 | Temp 98.1°F | Resp 16 | Ht 74.0 in | Wt 191.0 lb

## 2015-05-21 DIAGNOSIS — I10 Essential (primary) hypertension: Secondary | ICD-10-CM

## 2015-05-21 DIAGNOSIS — R972 Elevated prostate specific antigen [PSA]: Secondary | ICD-10-CM

## 2015-05-21 DIAGNOSIS — Z23 Encounter for immunization: Secondary | ICD-10-CM

## 2015-05-21 DIAGNOSIS — Z0000 Encounter for general adult medical examination without abnormal findings: Secondary | ICD-10-CM | POA: Diagnosis not present

## 2015-05-21 DIAGNOSIS — E785 Hyperlipidemia, unspecified: Secondary | ICD-10-CM

## 2015-05-21 LAB — COMPREHENSIVE METABOLIC PANEL
ALBUMIN: 4.5 g/dL (ref 3.6–5.1)
ALK PHOS: 78 U/L (ref 40–115)
ALT: 14 U/L (ref 9–46)
AST: 16 U/L (ref 10–35)
BILIRUBIN TOTAL: 0.5 mg/dL (ref 0.2–1.2)
BUN: 13 mg/dL (ref 7–25)
CHLORIDE: 102 mmol/L (ref 98–110)
CO2: 24 mmol/L (ref 20–31)
Calcium: 9.6 mg/dL (ref 8.6–10.3)
Creat: 0.8 mg/dL (ref 0.70–1.18)
Glucose, Bld: 112 mg/dL — ABNORMAL HIGH (ref 65–99)
Potassium: 4 mmol/L (ref 3.5–5.3)
SODIUM: 136 mmol/L (ref 135–146)
TOTAL PROTEIN: 7.3 g/dL (ref 6.1–8.1)

## 2015-05-21 LAB — CBC WITH DIFFERENTIAL/PLATELET
BASOS ABS: 0 10*3/uL (ref 0.0–0.1)
BASOS PCT: 0 % (ref 0–1)
Eosinophils Absolute: 0.1 10*3/uL (ref 0.0–0.7)
Eosinophils Relative: 1 % (ref 0–5)
HCT: 42.9 % (ref 39.0–52.0)
HEMOGLOBIN: 14.3 g/dL (ref 13.0–17.0)
LYMPHS ABS: 1.6 10*3/uL (ref 0.7–4.0)
Lymphocytes Relative: 23 % (ref 12–46)
MCH: 30.6 pg (ref 26.0–34.0)
MCHC: 33.3 g/dL (ref 30.0–36.0)
MCV: 91.7 fL (ref 78.0–100.0)
MONOS PCT: 9 % (ref 3–12)
MPV: 11.1 fL (ref 8.6–12.4)
Monocytes Absolute: 0.6 10*3/uL (ref 0.1–1.0)
NEUTROS ABS: 4.8 10*3/uL (ref 1.7–7.7)
NEUTROS PCT: 67 % (ref 43–77)
Platelets: 255 10*3/uL (ref 150–400)
RBC: 4.68 MIL/uL (ref 4.22–5.81)
RDW: 13.5 % (ref 11.5–15.5)
WBC: 7.1 10*3/uL (ref 4.0–10.5)

## 2015-05-21 LAB — LIPID PANEL
CHOLESTEROL: 218 mg/dL — AB (ref 125–200)
HDL: 47 mg/dL (ref >=40)
LDL Cholesterol: 147 mg/dL — ABNORMAL HIGH (ref <130)
TRIGLYCERIDES: 121 mg/dL (ref <150)
Total CHOL/HDL Ratio: 4.6 Ratio (ref <=5.0)
VLDL: 24 mg/dL (ref <30)

## 2015-05-21 NOTE — Patient Instructions (Signed)
  Mr. Jacob Bowen , Thank you for taking time to come for your Medicare Wellness Visit. I appreciate your ongoing commitment to your health goals. Please review the following plan we discussed and let me know if I can assist you in the future.   These are the goals we discussed: Goals    None      This is a list of the screening recommended for you and due dates:  Health Maintenance  Topic Date Due  . Tetanus Vaccine  04/30/1962  . Flu Shot  12/04/2014  . Pneumonia vaccines (2 of 2 - PPSV23) 05/09/2015  . Colon Cancer Screening  03/21/2017  . Shingles Vaccine  Completed

## 2015-05-21 NOTE — Progress Notes (Signed)
Subjective:    Jacob Bowen is a 75 y.o. male who presents for Medicare Annual Wellness Visit.  Date of last medicare wellness visit was not in the chart.He is here for an annual wellness check as well as a complete examination. He did have a hernia repair in 2013. He does exercise regularly. He continues on his blood pressure medications. He does have a history of elevated PSA and has seen Dr. Brunilda Payor  in the past. He was supposed be on a 6 month schedule however has not had anything done in approximately one year.He continues to race his sports car and very much enjoys this. He has a history of hyperlipidemia but presently is on no medications.  Names of Other Physician/Practitioners you currently use: 1. Carollee Herter, MD here for primary care 2. [name] eye doctor, last visit 09/2014 3. Marcelino Duster [place] dentist, last visit Jan 2017 4.   Medical Services you may have received from other than [facility] providers in the past year (date may be approximate) Dr Brunilda Payor  Preventative care: Last colonoscopy:2008 Prior vaccinations: TD or Tdap: 1996  Influenza: 05/21/15  Pneumococcal: 05/08/14 Shingles/Zostavax: 05/20/06  History reviewed: allergies, current medications, past family history, past medical history, past social history, past surgical history and problem list  Current Problems (verified) Patient Active Problem List   Diagnosis Date Noted  . Hyperlipidemia LDL goal <130 05/08/2014  . Elevated PSA 04/11/2011  . Hypertension 04/11/2011  . Hemorrhoids 04/11/2011     Immunization History  Administered Date(s) Administered  . DTaP 06/02/1994  . Influenza Split 04/11/2011  . Influenza Whole 05/17/2008, 05/18/2009  . Influenza, High Dose Seasonal PF 05/08/2014  . Pneumococcal Conjugate-13 05/08/2014  . Pneumococcal Polysaccharide-23 05/20/2006  . Zoster 05/20/2006    Risk Factors: Tobacco History  Smoking status  . Never Smoker   Smokeless tobacco  . Never Used    male does not smoke.  Patient is/is not a former smoker. Are there smokers in your home (other than you)?  No  Alcohol Current alcohol use: None  Caffeine Current caffeine use: None  Exercise Current exercise habits: Home exercise routine includes walking one hrs per days.  Current exercise: walking  Nutrition/Diet Current diet: in general, a "healthy" diet    Cardiac risk factors: advanced age (older than 71 for men, 47 for women), hypertension and male gender.  Depression Screen Nurse depression screen reviewed.  (Note: if answer to either of the following is "Yes", a more complete depression screening is indicated)   Q1: Over the past two weeks, have you felt down, depressed or hopeless? No  Q2: Over the past two weeks, have you felt little interest or pleasure in doing things? No  Have you lost interest or pleasure in daily life? No  Do you often feel hopeless? No  Do you cry easily over simple problems? No  Activities of Daily Living Nurse ADLs screen reviewed.  In your present state of health, do you have any difficulty performing the following activities?:  Driving? No Managing money?  No Feeding yourself? No Getting from bed to chair? No Climbing a flight of stairs? No Preparing food and eating?: No Bathing or showering? No Getting dressed: No Getting to the toilet? No Using the toilet:No Moving around from place to place: No In the past year have you fallen or had a near fall?:No   Are you sexually active?  No  Do you have more than one partner?  No  Vision Difficulties: No  Hearing Difficulties:  No Do you often ask people to speak up or repeat themselves? No Do you experience ringing or noises in your ears? No Do you have difficulty understanding soft or whispered voices? No  Cognition  Do you feel that you have a problem with memory? No  Do you often misplace items? No  Do you feel safe at home?  Yes  Advanced directives Does patient have a  Health Care Power of Attorney? No Does patient have a Living Will? No  Screening Tests Health Maintenance  Topic Date Due  . TETANUS/TDAP  04/30/1962  . INFLUENZA VACCINE  12/04/2014  . PNA vac Low Risk Adult (2 of 2 - PPSV23) 05/09/2015  . COLONOSCOPY  03/21/2017  . ZOSTAVAX  Completed      Objective:     Vision and hearing screens reviewed.   Blood pressure 128/80, pulse 68, temperature 98.1 F (36.7 C), temperature source Oral, resp. rate 16, height 6\' 2"  (1.88 m), weight 191 lb (86.637 kg). Body mass index is 24.51 kg/(m^2).  General appearance: alert, no distress, WD/WN,  male Cognitive Testing  Alert? Yes  Normal Appearance?Yes  Oriented to person? Yes  Place? Yes   Time? Yes  Recall of three objects?  Yes  Can perform simple calculations? Yes  Displays appropriate judgment?Yes  Can read the correct time from a watch face?Yes   HEENT: normocephalic, sclerae anicteric, TMs pearly, nares patent, no discharge or erythema, pharynx normal Oral cavity: MMM, no lesions Neck: supple, no lymphadenopathy, no thyromegaly, no masses Heart: RRR, normal S1, S2, no murmurs Lungs: CTA bilaterally, no wheezes, rhonchi, or rales Abdomen: +bs, soft, non tender, non distended, no masses, no hepatomegaly, no splenomegaly Musculoskeletal: nontender, no swelling, no obvious deformity Extremities: no edema, no cyanosis, no clubbing Pulses: 2+ symmetric, upper and lower extremities, normal cap refill Neurological: alert, oriented x 3, CN2-12 intact, strength normal upper extremities and lower extremities, sensation normal throughout, DTRs 2+ throughout, no cerebellar signs, gait normal Psychiatric: normal affect, behavior normal, pleasant   Assessment:  Annual physical exam - Plan: Visual acuity screening  Need for prophylactic vaccination and inoculation against influenza - Plan: Flu vaccine HIGH DOSE PF (Fluzone High dose)  Essential hypertension - Plan: CBC with  Differential/Platelet, Comprehensive metabolic panel  Elevated PSA - Plan: PSA, Medicare  Hyperlipidemia LDL goal <130 - Plan: Lipid panel     Plan:   During the course of the visit the patient was educated and counseled about appropriate screening and preventive services including:    given information concerning getting his immunizations updated.  Screening recommendations, referrals:  Vaccinations: Tdap vaccine prescription written to get this at the drugstore Influenza vaccine given today Pneumococcal vaccine done Shingles vaccine done Hep B vaccine done  Nutrition assessed and recommended  Colonoscopy as per schedule Recommended yearly ophthalmology/optometry visit for glaucoma screening and checkup Recommended yearly dental visit for hygiene and checkup Advanced directives - information given  Conditions/risks identified: none  Medicare Attestation I have personally reviewed: The patient's medical and social history Their use of alcohol, tobacco or illicit drugs Their current medications and supplements The patient's functional ability including ADLs,fall risks, home safety risks, cognitive, and hearing and visual impairment Diet and physical activities Evidence for depression or mood disorders  The patient's weight, height, BMI, and visual acuity have been recorded in the chart.  I have made referrals, counseling, and provided education to the patient based on review of the above and I have provided the patient with a written personalized  care plan for preventive services.     Carollee HerterLALONDE,JOHN CHARLES, MD   05/21/2015

## 2015-05-22 LAB — PSA, MEDICARE: PSA: 6.27 ng/mL — ABNORMAL HIGH (ref <=4.00)

## 2015-05-23 LAB — CP2131 PSA, TOTAL AND FREE
PSA, Free Pct: 22 % — ABNORMAL LOW (ref >25)
PSA, Free: 1.35 ng/mL
PSA: 6.27 ng/mL — ABNORMAL HIGH (ref <=4.00)

## 2015-05-25 ENCOUNTER — Other Ambulatory Visit: Payer: Self-pay | Admitting: Family Medicine

## 2015-08-03 ENCOUNTER — Other Ambulatory Visit: Payer: Self-pay | Admitting: Family Medicine

## 2016-05-29 ENCOUNTER — Other Ambulatory Visit: Payer: Self-pay | Admitting: Family Medicine

## 2016-06-06 ENCOUNTER — Other Ambulatory Visit: Payer: Self-pay | Admitting: Family Medicine

## 2016-06-06 ENCOUNTER — Encounter: Payer: Self-pay | Admitting: Family Medicine

## 2016-06-06 ENCOUNTER — Ambulatory Visit (INDEPENDENT_AMBULATORY_CARE_PROVIDER_SITE_OTHER): Payer: Medicare Other | Admitting: Family Medicine

## 2016-06-06 VITALS — BP 122/70 | HR 85 | Resp 16 | Ht 73.0 in | Wt 200.6 lb

## 2016-06-06 DIAGNOSIS — R972 Elevated prostate specific antigen [PSA]: Secondary | ICD-10-CM | POA: Diagnosis not present

## 2016-06-06 DIAGNOSIS — Z0000 Encounter for general adult medical examination without abnormal findings: Secondary | ICD-10-CM | POA: Diagnosis not present

## 2016-06-06 DIAGNOSIS — Z23 Encounter for immunization: Secondary | ICD-10-CM | POA: Diagnosis not present

## 2016-06-06 DIAGNOSIS — I10 Essential (primary) hypertension: Secondary | ICD-10-CM | POA: Diagnosis not present

## 2016-06-06 DIAGNOSIS — Z8371 Family history of colonic polyps: Secondary | ICD-10-CM | POA: Diagnosis not present

## 2016-06-06 DIAGNOSIS — R739 Hyperglycemia, unspecified: Secondary | ICD-10-CM

## 2016-06-06 DIAGNOSIS — R3914 Feeling of incomplete bladder emptying: Secondary | ICD-10-CM | POA: Diagnosis not present

## 2016-06-06 DIAGNOSIS — E785 Hyperlipidemia, unspecified: Secondary | ICD-10-CM

## 2016-06-06 DIAGNOSIS — N401 Enlarged prostate with lower urinary tract symptoms: Secondary | ICD-10-CM | POA: Diagnosis not present

## 2016-06-06 DIAGNOSIS — Z83719 Family history of colon polyps, unspecified: Secondary | ICD-10-CM | POA: Insufficient documentation

## 2016-06-06 DIAGNOSIS — N400 Enlarged prostate without lower urinary tract symptoms: Secondary | ICD-10-CM | POA: Insufficient documentation

## 2016-06-06 LAB — COMPREHENSIVE METABOLIC PANEL
ALK PHOS: 81 U/L (ref 40–115)
ALT: 8 U/L — AB (ref 9–46)
AST: 13 U/L (ref 10–35)
Albumin: 4.7 g/dL (ref 3.6–5.1)
BUN: 16 mg/dL (ref 7–25)
CO2: 26 mmol/L (ref 20–31)
Calcium: 9.5 mg/dL (ref 8.6–10.3)
Chloride: 105 mmol/L (ref 98–110)
Creat: 0.93 mg/dL (ref 0.70–1.18)
GLUCOSE: 116 mg/dL — AB (ref 65–99)
POTASSIUM: 3.9 mmol/L (ref 3.5–5.3)
Sodium: 138 mmol/L (ref 135–146)
Total Bilirubin: 0.4 mg/dL (ref 0.2–1.2)
Total Protein: 8 g/dL (ref 6.1–8.1)

## 2016-06-06 LAB — LIPID PANEL
Cholesterol: 214 mg/dL — ABNORMAL HIGH (ref <200)
HDL: 40 mg/dL — AB (ref >40)
LDL CALC: 130 mg/dL — AB (ref <100)
Total CHOL/HDL Ratio: 5.4 Ratio — ABNORMAL HIGH (ref <5.0)
Triglycerides: 218 mg/dL — ABNORMAL HIGH (ref <150)
VLDL: 44 mg/dL — ABNORMAL HIGH (ref <30)

## 2016-06-06 LAB — CBC WITH DIFFERENTIAL/PLATELET
Basophils Absolute: 0 cells/uL (ref 0–200)
Basophils Relative: 0 %
EOS PCT: 3 %
Eosinophils Absolute: 222 cells/uL (ref 15–500)
HCT: 43.2 % (ref 38.5–50.0)
Hemoglobin: 14.5 g/dL (ref 13.2–17.1)
LYMPHS PCT: 35 %
Lymphs Abs: 2590 cells/uL (ref 850–3900)
MCH: 30.7 pg (ref 27.0–33.0)
MCHC: 33.6 g/dL (ref 32.0–36.0)
MCV: 91.3 fL (ref 80.0–100.0)
MONOS PCT: 10 %
MPV: 11.2 fL (ref 7.5–12.5)
Monocytes Absolute: 740 cells/uL (ref 200–950)
NEUTROS PCT: 52 %
Neutro Abs: 3848 cells/uL (ref 1500–7800)
PLATELETS: 235 10*3/uL (ref 140–400)
RBC: 4.73 MIL/uL (ref 4.20–5.80)
RDW: 13 % (ref 11.0–15.0)
WBC: 7.4 10*3/uL (ref 4.0–10.5)

## 2016-06-06 MED ORDER — FINASTERIDE 5 MG PO TABS: 5.0000 mg | ORAL_TABLET | Freq: Every day | ORAL | 3 refills | Status: DC

## 2016-06-06 MED ORDER — LISINOPRIL-HYDROCHLOROTHIAZIDE 10-12.5 MG PO TABS: 1.0000 | ORAL_TABLET | Freq: Every day | ORAL | 3 refills | Status: DC

## 2016-06-06 NOTE — Patient Instructions (Signed)
Current Outpatient Prescriptions on File Prior to Visit  Medication Sig Dispense Refill  . doxazosin (CARDURA) 4 MG tablet TAKE 1 TABLET EVERY DAY 90 tablet 0   No current facility-administered medications on file prior to visit.

## 2016-06-06 NOTE — Progress Notes (Addendum)
Jacob Bowen is a 76 y.o. male who presents for annual wellness visit and follow-up on chronic medical conditions.  He has the following concerns: He is having some difficulty with vision and is scheduled to have an ophthalmologic procedure done. He also continues to try sports cars and does need appropriate forms filled out. He is having difficulty with urinary urgency, incomplete emptying, decreased stream and hesitancy. Presently he is on Cardura. Does have a history of elevated PSA and we have been monitoring this. He does have a history of colonic polyps and is planning on scheduling a follow-up colonoscopy.   Immunizations and Health Maintenance Immunization History  Administered Date(s) Administered  . DTaP 06/02/1994  . Influenza Split 04/11/2011  . Influenza Whole 05/17/2008, 05/18/2009  . Influenza, High Dose Seasonal PF 05/08/2014, 05/21/2015, 06/06/2016  . Pneumococcal Conjugate-13 05/08/2014  . Pneumococcal Polysaccharide-23 05/20/2006  . Zoster 05/20/2006   Health Maintenance Due  Topic Date Due  . TETANUS/TDAP  04/30/1962  . PNA vac Low Risk Adult (2 of 2 - PPSV23) 05/09/2015    Last colonoscopy: Dorena CookeyJohn Bowen- due for one. Last one 4-5 years ago Last PSA:  05/21/2015 Dentist:Jacob Bowen.  Ophtho: Jacob Bowen, hsa eye surgery next week  Exercise: Does not exercise  Other doctors caring for patient include: none per pt   Advanced Directives: none Information given. Does Patient Have a Medical Advance Directive?: No Would patient like information on creating a medical advance directive?: Yes (MAU/Ambulatory/Procedural Areas - Information given)  Depression screen:  See questionnaire below.     Depression screen [name]9 06/06/2016 05/21/2015 05/08/2014  Decreased Interest 0 0 0  Down, Depressed, Hopeless 0 0 0  PHQ - 2 Score 0 0 0    Fall Screen: See Questionaire below.   Fall Risk  06/06/2016 05/21/2015 05/08/2014  Falls in the past year? Yes No No  Number falls in  past yr: 1 - -  Injury with Fall? No - -    ADL screen:  See questionnaire below.  Functional Status Survey: Is the patient deaf or have difficulty hearing?: No Does the patient have difficulty seeing, even when wearing glasses/contacts?: Yes (sees eye doctor- having surgery next week. ) Does the patient have difficulty concentrating, remembering, or making decisions?: No Does the patient have difficulty walking or climbing stairs?: No Does the patient have difficulty dressing or bathing?: No Does the patient have difficulty doing errands alone such as visiting a doctor's office or shopping?: No   Review of Systems  Constitutional: -, -unexpected weight change, -anorexia, -fatigue Allergy: -sneezing, -itching, -congestion Dermatology: denies changing moles, rash, lumps ENT: -runny nose, -ear pain, -sore throat,  Cardiology:  -chest pain, -palpitations, -orthopnea, Respiratory: -cough, -shortness of breath, -dyspnea on exertion, -wheezing,  Gastroenterology: -abdominal pain, -nausea, -vomiting, -diarrhea, -constipation, -dysphagia Hematology: -bleeding or bruising problems Musculoskeletal: -myalgias, -joint swelling, -back pain, - Ophthalmology: -vision changes,  Urology: -dysuria, -difficulty urinating,  -urinary frequency, -urgency, incontinence Neurology: -, -numbness, , -memory loss, -falls, -dizziness    PHYSICAL EXAM:  BP 122/70   Pulse 85   Resp 16   Ht 6\' 1"  (1.854 m)   Wt 200 lb 9.6 oz (91 kg)   BMI 26.47 kg/m   General Appearance: Alert, cooperative, no distress, appears stated age Head: Normocephalic, without obvious abnormality, atraumatic Eyes: PERRL, conjunctiva/corneas clear, EOM's intact, fundi benign Ears: Normal TM's and external ear canals Nose: Nares normal, mucosa normal, no drainage or sinus   tenderness Throat: Lips, mucosa, and tongue normal;  teeth and gums normal Neck: Supple, no lymphadenopathy, thyroid:no enlargement/tenderness/nodules; no  carotid bruit or JVD Lungs: Clear to auscultation bilaterally without wheezes, rales or ronchi; respirations unlabored Heart: Regular rate and rhythm, S1 and S2 normal, no murmur, rub or gallop Abdomen: Soft, non-tender, nondistended, normoactive bowel sounds, no masses, no hepatosplenomegaly Extremities: No clubbing, cyanosis or edema Pulses: 2+ and symmetric all extremities Skin: Skin color, texture, turgor normal, no rashes or lesions Lymph nodes: Cervical, supraclavicular, and axillary nodes normal Neurologic: CNII-XII intact, normal strength, sensation and gait; reflexes 2+ and symmetric throughout   Psych: Normal mood, affect, hygiene and grooming Rectal exam does show enlarged prostate with questionable nodule on the right. ASSESSMENT/PLAN: Routine general medical examination at a health care facility  Need for prophylactic vaccination and inoculation against influenza - Plan: Flu vaccine HIGH DOSE PF (Fluzone High Dose)  Essential hypertension - Plan: CBC with Differential/Platelet, Comprehensive metabolic panel, lisinopril-hydrochlorothiazide (PRINZIDE,ZESTORETIC) 10-12.5 MG tablet  Hyperlipidemia LDL goal <130 - Plan: Lipid panel  Benign prostatic hyperplasia with incomplete bladder emptying - Plan: PSA, finasteride (PROSCAR) 5 MG tablet  FH: colonic polyps  Elevated PSA - Plan: PSA  Elevated blood sugar He is to hold off on starting on the finasteride until I get the blood results back. Discussed the possibility of referring to urology if the PSA continues to rise versus continued active surveillance. Also may possibly readjust his Cardura.   recommended at least 30 minutes of aerobic activity at least 5 days/week; proper sunscreen use reviewed; healthy diet and alcohol recommendations (less than or equal to 2 drinks/day) reviewed; regular seatbelt use; changing batteries in smoke detectors. Immunization recommendations discussed.     Medicare Attestation I have  personally reviewed: The patient's medical and social history Their use of alcohol, tobacco or illicit drugs Their current medications and supplements The patient's functional ability including ADLs,fall risks, home safety risks, cognitive, and hearing and visual impairment Diet and physical activities Evidence for depression or mood disorders  The patient's weight, height, and BMI have been recorded in the chart.  I have made referrals, counseling, and provided education to the patient based on review of the above and I have provided the patient with a written personalized care plan for preventive services.     Carollee Herter, MD   07/17/2016

## 2016-06-07 LAB — PSA: PSA: 5.2 ng/mL — AB (ref <=4.0)

## 2016-06-10 LAB — PSA, TOTAL AND FREE
PSA, % Free: 19 % — ABNORMAL LOW (ref >25)
PSA, FREE: 1.2 ng/mL
PSA, Total: 6.2 ng/mL — ABNORMAL HIGH (ref <=4.0)

## 2016-06-10 LAB — HEMOGLOBIN A1C
HEMOGLOBIN A1C: 5.2 % (ref <5.7)
MEAN PLASMA GLUCOSE: 103 mg/dL

## 2016-08-24 ENCOUNTER — Other Ambulatory Visit: Payer: Self-pay | Admitting: Family Medicine

## 2017-06-06 ENCOUNTER — Other Ambulatory Visit: Payer: Self-pay | Admitting: Family Medicine

## 2017-06-06 DIAGNOSIS — N401 Enlarged prostate with lower urinary tract symptoms: Secondary | ICD-10-CM

## 2017-06-06 DIAGNOSIS — R3914 Feeling of incomplete bladder emptying: Principal | ICD-10-CM

## 2017-06-22 ENCOUNTER — Ambulatory Visit (INDEPENDENT_AMBULATORY_CARE_PROVIDER_SITE_OTHER): Payer: Medicare Other | Admitting: Family Medicine

## 2017-06-22 ENCOUNTER — Encounter: Payer: Self-pay | Admitting: Family Medicine

## 2017-06-22 VITALS — BP 128/80 | HR 100 | Wt 195.6 lb

## 2017-06-22 DIAGNOSIS — Z1211 Encounter for screening for malignant neoplasm of colon: Secondary | ICD-10-CM | POA: Diagnosis not present

## 2017-06-22 DIAGNOSIS — I10 Essential (primary) hypertension: Secondary | ICD-10-CM

## 2017-06-22 DIAGNOSIS — Z0000 Encounter for general adult medical examination without abnormal findings: Secondary | ICD-10-CM

## 2017-06-22 DIAGNOSIS — R972 Elevated prostate specific antigen [PSA]: Secondary | ICD-10-CM

## 2017-06-22 DIAGNOSIS — N401 Enlarged prostate with lower urinary tract symptoms: Secondary | ICD-10-CM | POA: Diagnosis not present

## 2017-06-22 DIAGNOSIS — E785 Hyperlipidemia, unspecified: Secondary | ICD-10-CM

## 2017-06-22 DIAGNOSIS — R3914 Feeling of incomplete bladder emptying: Secondary | ICD-10-CM | POA: Diagnosis not present

## 2017-06-22 LAB — POCT URINALYSIS DIP (PROADVANTAGE DEVICE)
BILIRUBIN UA: NEGATIVE
BILIRUBIN UA: NEGATIVE mg/dL
Glucose, UA: NEGATIVE mg/dL
Leukocytes, UA: NEGATIVE
NITRITE UA: NEGATIVE
PH UA: 6.5 (ref 5.0–8.0)
Protein Ur, POC: NEGATIVE mg/dL
SPECIFIC GRAVITY, URINE: 1.015
Urobilinogen, Ur: 3.5

## 2017-06-22 NOTE — Addendum Note (Signed)
Addended by: Renelda LomaHENRY, Jaykub Mackins on: 06/22/2017 03:25 PM   Modules accepted: Orders

## 2017-06-22 NOTE — Progress Notes (Signed)
Jacob Bowen is a 77 y.o. male who presents for annual wellness visit and follow-up on chronic medical conditions.  He continues on finasteride and has finally started noting an improvement in his urinary symptoms.  He continues on Cardura as well as lisinopril for his blood pressure.  Does have a previous history of elevated PSA however the numbers have remained fairly constant.  His last colonoscopy was over 10 years ago.  There is a history of colonic polyps in his family however his last colonoscopy was negative and the recommendation was a 10-year repeat so I will therefore order the Cologuard.  He continues to race cars and very much enjoys this although he does state that this is the last year that he will be doing this.   Immunizations and Health Maintenance Immunization History  Administered Date(s) Administered  . DTaP 06/02/1994  . Influenza Split 04/11/2011  . Influenza Whole 05/17/2008, 05/18/2009  . Influenza, High Dose Seasonal PF 05/08/2014, 05/21/2015, 06/06/2016  . Pneumococcal Conjugate-13 05/08/2014  . Pneumococcal Polysaccharide-23 05/20/2006  . Zoster 05/20/2006   Health Maintenance Due  Topic Date Due  . TETANUS/TDAP  04/30/1962  . PNA vac Low Risk Adult (2 of 2 - PPSV23) 05/09/2015  . INFLUENZA VACCINE  12/03/2016  . COLONOSCOPY  03/21/2017    Last colonoscopy: 10 years ago Last WUJ:[id number]:last year Dentist:08 Ophtho:last year Exercise: walk three or our days a week  Other doctors caring for patient include:none Advanced Directives:no info given    Depression screen:  See questionnaire below.     Depression screen [facility] 06/22/2017 06/06/2016 05/21/2015 05/08/2014  Decreased Interest 0 0 0 0  Down, Depressed, Hopeless - 0 0 0  PHQ - 2 Score 0 0 0 0    Fall Screen: See Questionaire below.   Fall Risk  06/22/2017 06/06/2016 05/21/2015 05/08/2014  Falls in the past year? No Yes No No  Comment - slipped on ice - -  Number falls in past yr: - 1 - -  Injury with Fall?  - No - -    ADL screen:  See questionnaire below.  Functional Status Survey: Is the patient deaf or have difficulty hearing?: No Does the patient have difficulty seeing, even when wearing glasses/contacts?: No Does the patient have difficulty concentrating, remembering, or making decisions?: No Does the patient have difficulty walking or climbing stairs?: No Does the patient have difficulty dressing or bathing?: No Does the patient have difficulty doing errands alone such as visiting a doctor's office or shopping?: No   Review of Systems  Constitutional: -, -unexpected weight change, -anorexia, -fatigue Allergy: -sneezing, -itching, -congestion Dermatology: denies changing moles, rash, lumps ENT: -runny nose, -ear pain, -sore throat,  Cardiology:  -chest pain, -palpitations, -orthopnea, Respiratory: -cough, -shortness of breath, -dyspnea on exertion, -wheezing,  Gastroenterology: -abdominal pain, -nausea, -vomiting, -diarrhea, -constipation, -dysphagia Hematology: -bleeding or bruising problems Musculoskeletal: -arthralgias, -myalgias, -joint swelling, -back pain, - Ophthalmology: -vision changes,  Urology: -dysuria, -difficulty urinating,  -urinary frequency, -urgency, incontinence Neurology: -, -numbness, , -memory loss, -falls, -dizziness    PHYSICAL EXAM:   General Appearance: Alert, cooperative, no distress, appears stated age Head: Normocephalic, without obvious abnormality, atraumatic Eyes: PERRL, conjunctiva/corneas clear, EOM's intact, fundi benign Ears: Normal TM's and external ear canals Nose: Nares normal, mucosa normal, no drainage or sinus   tenderness Throat: Lips, mucosa, and tongue normal; teeth and gums normal Neck: Supple, no lymphadenopathy, thyroid:no enlargement/tenderness/nodules; no carotid bruit or JVD Lungs: Clear to auscultation bilaterally without wheezes, rales  or ronchi; respirations unlabored Heart: Regular rate and rhythm, S1 and S2 normal, no  murmur, rub or gallop Abdomen: Soft, non-tender, nondistended, normoactive bowel sounds, no masses, no hepatosplenomegaly Extremities: No clubbing, cyanosis or edema Pulses: 2+ and symmetric all extremities Skin: Skin color, texture, turgor normal, no rashes or lesions Lymph nodes: Cervical, supraclavicular, and axillary nodes normal Neurologic: CNII-XII intact, normal strength, sensation and gait; reflexes 2+ and symmetric throughout   Psych: Normal mood, affect, hygiene and grooming  ASSESSMENT/PLAN: Colon cancer screening - Plan: Cologuard, CBC with Differential/Platelet, Comprehensive metabolic panel  Benign prostatic hyperplasia with incomplete bladder emptying  Elevated PSA  Hyperlipidemia LDL goal <130 - Plan: Lipid panel  Essential hypertension - Plan: CBC with Differential/Platelet, Comprehensive metabolic panel Recommend getting Shingrix and TDaP   Discussed PSA screening (risks/benefits) patient has decided to not pursue PSA testing., recommended at least 30 minutes of aerobic activity at least 5 days/week; proper sunscreen use reviewed; healthy diet and alcohol recommendations (less than or equal to 2 drinks/day) reviewed; Mar[facility] Kitchen Immunization recommendations discussed.  Colonoscopy recommendations reviewed.   Medicare Attestation I have personally reviewed: The patient's medical and social history Their use of alcohol, tobacco or illicit drugs Their current medications and supplements The patient's functional ability including ADLs,fall risks, home safety risks, cognitive, and hearing and visual impairment Diet and physical activities Evidence for depression or mood disorders  The patient's weight, height, and BMI have been recorded in the chart.  I have made referrals, counseling, and provided education to the patient based on review of the above and I have provided the patient with a written personalized care plan for preventive services.     Sharlot Gowda, MD   06/22/2017

## 2017-06-23 LAB — CBC WITH DIFFERENTIAL/PLATELET
BASOS: 0 %
Basophils Absolute: 0 10*3/uL (ref 0.0–0.2)
EOS (ABSOLUTE): 0.1 10*3/uL (ref 0.0–0.4)
Eos: 1 %
HEMOGLOBIN: 14.9 g/dL (ref 13.0–17.7)
Hematocrit: 43.5 % (ref 37.5–51.0)
IMMATURE GRANULOCYTES: 0 %
Immature Grans (Abs): 0 10*3/uL (ref 0.0–0.1)
LYMPHS: 25 %
Lymphocytes Absolute: 1.8 10*3/uL (ref 0.7–3.1)
MCH: 31.6 pg (ref 26.6–33.0)
MCHC: 34.3 g/dL (ref 31.5–35.7)
MCV: 92 fL (ref 79–97)
Monocytes Absolute: 0.7 10*3/uL (ref 0.1–0.9)
Monocytes: 10 %
NEUTROS PCT: 64 %
Neutrophils Absolute: 4.6 10*3/uL (ref 1.4–7.0)
Platelets: 232 10*3/uL (ref 150–379)
RBC: 4.71 x10E6/uL (ref 4.14–5.80)
RDW: 13.2 % (ref 12.3–15.4)
WBC: 7.2 10*3/uL (ref 3.4–10.8)

## 2017-06-23 LAB — COMPREHENSIVE METABOLIC PANEL
A/G RATIO: 1.5 (ref 1.2–2.2)
ALT: 10 IU/L (ref 0–44)
AST: 15 IU/L (ref 0–40)
Albumin: 4.6 g/dL (ref 3.5–4.8)
Alkaline Phosphatase: 96 IU/L (ref 39–117)
BUN/Creatinine Ratio: 10 (ref 10–24)
BUN: 9 mg/dL (ref 8–27)
Bilirubin Total: 0.3 mg/dL (ref 0.0–1.2)
CALCIUM: 9.7 mg/dL (ref 8.6–10.2)
CO2: 22 mmol/L (ref 20–29)
Chloride: 100 mmol/L (ref 96–106)
Creatinine, Ser: 0.9 mg/dL (ref 0.76–1.27)
GFR, EST AFRICAN AMERICAN: 97 mL/min/{1.73_m2} (ref >59)
GFR, EST NON AFRICAN AMERICAN: 84 mL/min/{1.73_m2} (ref >59)
Globulin, Total: 3 g/dL (ref 1.5–4.5)
Glucose: 117 mg/dL — ABNORMAL HIGH (ref 65–99)
POTASSIUM: 3.9 mmol/L (ref 3.5–5.2)
Sodium: 139 mmol/L (ref 134–144)
TOTAL PROTEIN: 7.6 g/dL (ref 6.0–8.5)

## 2017-06-23 LAB — LIPID PANEL
CHOL/HDL RATIO: 4.7 ratio (ref 0.0–5.0)
Cholesterol, Total: 206 mg/dL — ABNORMAL HIGH (ref 100–199)
HDL: 44 mg/dL (ref >39)
LDL Calculated: 131 mg/dL — ABNORMAL HIGH (ref 0–99)
Triglycerides: 153 mg/dL — ABNORMAL HIGH (ref 0–149)
VLDL Cholesterol Cal: 31 mg/dL (ref 5–40)

## 2017-06-29 DIAGNOSIS — Z1211 Encounter for screening for malignant neoplasm of colon: Secondary | ICD-10-CM | POA: Diagnosis not present

## 2017-06-29 DIAGNOSIS — Z1212 Encounter for screening for malignant neoplasm of rectum: Secondary | ICD-10-CM | POA: Diagnosis not present

## 2017-06-30 LAB — COLOGUARD: COLOGUARD: NEGATIVE

## 2017-08-17 ENCOUNTER — Other Ambulatory Visit: Payer: Self-pay | Admitting: Family Medicine

## 2017-08-17 DIAGNOSIS — I10 Essential (primary) hypertension: Secondary | ICD-10-CM

## 2017-09-15 ENCOUNTER — Other Ambulatory Visit: Payer: Self-pay | Admitting: Family Medicine

## 2017-09-15 DIAGNOSIS — R3914 Feeling of incomplete bladder emptying: Principal | ICD-10-CM

## 2017-09-15 DIAGNOSIS — N401 Enlarged prostate with lower urinary tract symptoms: Secondary | ICD-10-CM

## 2017-09-18 ENCOUNTER — Other Ambulatory Visit: Payer: Self-pay | Admitting: Family Medicine

## 2017-12-17 ENCOUNTER — Other Ambulatory Visit: Payer: Self-pay | Admitting: Family Medicine

## 2017-12-17 DIAGNOSIS — R3914 Feeling of incomplete bladder emptying: Principal | ICD-10-CM

## 2017-12-17 DIAGNOSIS — N401 Enlarged prostate with lower urinary tract symptoms: Secondary | ICD-10-CM

## 2018-02-25 ENCOUNTER — Other Ambulatory Visit: Payer: Medicare Other

## 2018-03-11 ENCOUNTER — Other Ambulatory Visit: Payer: Self-pay | Admitting: Family Medicine

## 2018-03-11 DIAGNOSIS — N401 Enlarged prostate with lower urinary tract symptoms: Secondary | ICD-10-CM

## 2018-03-11 DIAGNOSIS — R3914 Feeling of incomplete bladder emptying: Principal | ICD-10-CM

## 2018-06-02 ENCOUNTER — Other Ambulatory Visit: Payer: Self-pay | Admitting: Family Medicine

## 2018-06-02 DIAGNOSIS — N401 Enlarged prostate with lower urinary tract symptoms: Secondary | ICD-10-CM

## 2018-06-02 DIAGNOSIS — R3914 Feeling of incomplete bladder emptying: Principal | ICD-10-CM

## 2018-06-23 ENCOUNTER — Ambulatory Visit: Payer: Medicare Other | Admitting: Family Medicine

## 2018-06-24 ENCOUNTER — Ambulatory Visit (INDEPENDENT_AMBULATORY_CARE_PROVIDER_SITE_OTHER): Payer: Medicare Other | Admitting: Family Medicine

## 2018-06-24 ENCOUNTER — Encounter: Payer: Self-pay | Admitting: Family Medicine

## 2018-06-24 VITALS — BP 120/72 | HR 93 | Temp 98.0°F | Ht 72.0 in | Wt 197.0 lb

## 2018-06-24 DIAGNOSIS — E785 Hyperlipidemia, unspecified: Secondary | ICD-10-CM | POA: Diagnosis not present

## 2018-06-24 DIAGNOSIS — I10 Essential (primary) hypertension: Secondary | ICD-10-CM

## 2018-06-24 DIAGNOSIS — Z131 Encounter for screening for diabetes mellitus: Secondary | ICD-10-CM | POA: Diagnosis not present

## 2018-06-24 DIAGNOSIS — N401 Enlarged prostate with lower urinary tract symptoms: Secondary | ICD-10-CM

## 2018-06-24 DIAGNOSIS — R972 Elevated prostate specific antigen [PSA]: Secondary | ICD-10-CM | POA: Diagnosis not present

## 2018-06-24 DIAGNOSIS — Z0000 Encounter for general adult medical examination without abnormal findings: Secondary | ICD-10-CM

## 2018-06-24 DIAGNOSIS — R3914 Feeling of incomplete bladder emptying: Secondary | ICD-10-CM

## 2018-06-24 LAB — POCT URINALYSIS DIP (PROADVANTAGE DEVICE)
BILIRUBIN UA: NEGATIVE
GLUCOSE UA: NEGATIVE mg/dL
Ketones, POC UA: NEGATIVE mg/dL
Leukocytes, UA: NEGATIVE
Nitrite, UA: NEGATIVE
Protein Ur, POC: NEGATIVE mg/dL
SPECIFIC GRAVITY, URINE: 1.02
UUROB: 3.5
pH, UA: 6 (ref 5.0–8.0)

## 2018-06-24 NOTE — Patient Instructions (Signed)
  Mr. Beere , Thank you for taking time to come for your Medicare Wellness Visit. I appreciate your ongoing commitment to your health goals. Please review the following plan we discussed and let me know if I can assist you in the future.   These are the goals we discussed: Goals   None     This is a list of the screening recommended for you and due dates:  Health Maintenance  Topic Date Due  . Tetanus Vaccine  04/30/1962  . Pneumonia vaccines (2 of 2 - PPSV23) 05/09/2015  . Colon Cancer Screening  04/16/2021  . Flu Shot  Completed

## 2018-06-24 NOTE — Progress Notes (Signed)
Jacob Bowen is a 78 y.o. male who presents for annual wellness visit and follow-up on chronic medical conditions.  He has no particular concerns or complaints.  He continues on finasteride as well as Cardura and is having no difficulty with them.  He is also taking lisinopril/HCTZ without trouble.  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   Immunizations and Health Maintenance Immunization History  Administered Date(s) Administered  . DTaP 06/02/1994  . Influenza Split 04/11/2011  . Influenza Whole 05/17/2008, 05/18/2009  . Influenza, High Dose Seasonal PF 05/08/2014, 05/21/2015, 06/06/2016  . Influenza-Unspecified 06/20/2017, 05/24/2018  . Pneumococcal Conjugate-13 05/08/2014  . Pneumococcal Polysaccharide-23 05/20/2006  . Zoster 05/20/2006   Health Maintenance Due  Topic Date Due  . TETANUS/TDAP  04/30/1962  . PNA vac Low Risk Adult (2 of 2 - PPSV23) 05/09/2015    Last colonoscopy: cologuard 2019 Last PSA: 06-06-2016 Dentist: 2008 Ophtho: one half years ago Exercise: ride bike 30 min most days  Other doctors caring for patient include: Dr. Brunilda Payor urology  Advanced Directives: Not discussed    Depression screen:  See questionnaire below.     Depression screen [facility] 2/9 06/24/2018 06/22/2017 06/06/2016 05/21/2015 05/08/2014  Decreased Interest 0 0 0 0 0  Down, Depressed, Hopeless 0 - 0 0 0  PHQ - 2 Score 0 0 0 0 0    Fall Screen: See Questionaire below.   Fall Risk  06/24/2018 06/22/2017 06/06/2016 05/21/2015 05/08/2014  Falls in the past year? 0 No Yes No No  Comment - - slipped on ice - -  Number falls in past yr: - - 1 - -  Injury with Fall? - - No - -    ADL screen:  See questionnaire below.  Functional Status Survey: Is the patient deaf or have difficulty hearing?: No Does the patient have difficulty seeing, even when wearing glasses/contacts?: No Does the patient have difficulty concentrating, remembering, or making decisions?: No Does the  patient have difficulty walking or climbing stairs?: No Does the patient have difficulty dressing or bathing?: No Does the patient have difficulty doing errands alone such as visiting a doctor's office or shopping?: No   Review of Systems  Constitutional: -, -unexpected weight change, -anorexia, -fatigue Allergy: -sneezing, -itching, -congestion Dermatology: denies changing moles, rash, lumps ENT: -runny nose, -ear pain, -sore throat,  Cardiology:  -chest pain, -palpitations, -orthopnea, Respiratory: -cough, -shortness of breath, -dyspnea on exertion, -wheezing,  Gastroenterology: -abdominal pain, -nausea, -vomiting, -diarrhea, -constipation, -dysphagia Hematology: -bleeding or bruising problems Musculoskeletal: -arthralgias, -myalgias, -joint swelling, -back pain, - Ophthalmology: -vision changes,  Urology: -dysuria, -difficulty urinating,  -urinary frequency, -urgency, incontinence Neurology: -, -numbness, , -memory loss, -falls, -dizziness    PHYSICAL EXAM:  BP 120/72 (BP Location: Left Arm, Patient Position: Sitting)   Pulse 93   Temp 98 F (36.7 C)   Ht 6' (1.829 m)   Wt 197 lb (89.4 kg)   SpO2 95%   BMI 26.72 kg/m   General Appearance: Alert, cooperative, no distress, appears stated age Head: Normocephalic, without obvious abnormality, atraumatic Eyes: PERRL, conjunctiva/corneas clear, EOM's intact, fundi benign Ears: Normal TM's and external ear canals Nose: Nares normal, mucosa normal, no drainage or sinus   tenderness Throat: Lips, mucosa, and tongue normal; teeth and gums normal Neck: Supple, no lymphadenopathy, thyroid:no enlargement/tenderness/nodules; no carotid bruit or JVD Lungs: Clear to auscultation bilaterally without wheezes, rales or ronchi; respirations unlabored Heart: Regular rate and rhythm, S1 and S2 normal, no murmur,  rub or gallop Abdomen: Soft, non-tender, nondistended, normoactive bowel sounds, no masses, no hepatosplenomegaly Extremities: No  clubbing, cyanosis or edema Pulses: 2+ and symmetric all extremities Skin: Skin color, texture, turgor normal, no rashes or lesions Lymph nodes: Cervical, supraclavicular, and axillary nodes normal Neurologic: CNII-XII intact, normal strength, sensation and gait; reflexes 2+ and symmetric throughout   Psych: Normal mood, affect, hygiene and grooming  ASSESSMENT/PLAN: Routine medical exam - Plan: POCT Urinalysis DIP (Proadvantage Device), CBC with Differential/Platelet, Comprehensive metabolic panel, Lipid panel  Benign prostatic hyperplasia with incomplete bladder emptying  Elevated PSA  Hyperlipidemia LDL goal <130 - Plan: Lipid panel  Essential hypertension - Plan: CBC with Differential/Platelet, Comprehensive metabolic panel     , recommended at least 30 minutes of aerobic activity at least 5 days/week; proper sunscreen use reviewed; healthy diet and r Immunization recommendations discussed.  Colonoscopy recommendations reviewed.   Medicare Attestation I have personally reviewed: The patient's medical and social history Their use of alcohol, tobacco or illicit drugs Their current medications and supplements The patient's functional ability including ADLs,fall risks, home safety risks, cognitive, and hearing and visual impairment Diet and physical activities Evidence for depression or mood disorders  The patient's weight, height, and BMI have been recorded in the chart.  I have made referrals, counseling, and provided education to the patient based on review of the above and I have provided the patient with a written personalized care plan for preventive services.     Sharlot Gowda, MD   06/24/2018

## 2018-06-25 LAB — CBC WITH DIFFERENTIAL/PLATELET
Basophils Absolute: 0 10*3/uL (ref 0.0–0.2)
Basos: 0 %
EOS (ABSOLUTE): 0.1 10*3/uL (ref 0.0–0.4)
EOS: 1 %
HEMATOCRIT: 42.8 % (ref 37.5–51.0)
HEMOGLOBIN: 14.9 g/dL (ref 13.0–17.7)
IMMATURE GRANULOCYTES: 0 %
Immature Grans (Abs): 0 10*3/uL (ref 0.0–0.1)
LYMPHS ABS: 2.2 10*3/uL (ref 0.7–3.1)
Lymphs: 30 %
MCH: 31 pg (ref 26.6–33.0)
MCHC: 34.8 g/dL (ref 31.5–35.7)
MCV: 89 fL (ref 79–97)
MONOCYTES: 12 %
Monocytes Absolute: 0.9 10*3/uL (ref 0.1–0.9)
NEUTROS PCT: 57 %
Neutrophils Absolute: 4.2 10*3/uL (ref 1.4–7.0)
Platelets: 220 10*3/uL (ref 150–450)
RBC: 4.81 x10E6/uL (ref 4.14–5.80)
RDW: 12.2 % (ref 11.6–15.4)
WBC: 7.4 10*3/uL (ref 3.4–10.8)

## 2018-06-25 LAB — LIPID PANEL
Chol/HDL Ratio: 4.6 ratio (ref 0.0–5.0)
Cholesterol, Total: 208 mg/dL — ABNORMAL HIGH (ref 100–199)
HDL: 45 mg/dL (ref >39)
LDL CALC: 138 mg/dL — AB (ref 0–99)
TRIGLYCERIDES: 125 mg/dL (ref 0–149)
VLDL Cholesterol Cal: 25 mg/dL (ref 5–40)

## 2018-06-25 LAB — COMPREHENSIVE METABOLIC PANEL
ALBUMIN: 4.8 g/dL — AB (ref 3.7–4.7)
ALT: 11 IU/L (ref 0–44)
AST: 16 IU/L (ref 0–40)
Albumin/Globulin Ratio: 1.9 (ref 1.2–2.2)
Alkaline Phosphatase: 98 IU/L (ref 39–117)
BUN / CREAT RATIO: 18 (ref 10–24)
BUN: 18 mg/dL (ref 8–27)
Bilirubin Total: 0.4 mg/dL (ref 0.0–1.2)
CALCIUM: 9.2 mg/dL (ref 8.6–10.2)
CO2: 20 mmol/L (ref 20–29)
CREATININE: 0.98 mg/dL (ref 0.76–1.27)
Chloride: 100 mmol/L (ref 96–106)
GFR calc Af Amer: 87 mL/min/{1.73_m2} (ref >59)
GFR, EST NON AFRICAN AMERICAN: 75 mL/min/{1.73_m2} (ref >59)
GLOBULIN, TOTAL: 2.5 g/dL (ref 1.5–4.5)
Glucose: 127 mg/dL — ABNORMAL HIGH (ref 65–99)
Potassium: 3.6 mmol/L (ref 3.5–5.2)
SODIUM: 138 mmol/L (ref 134–144)
Total Protein: 7.3 g/dL (ref 6.0–8.5)

## 2018-06-28 LAB — HGB A1C W/O EAG: Hgb A1c MFr Bld: 5.5 % (ref 4.8–5.6)

## 2018-06-28 LAB — SPECIMEN STATUS REPORT

## 2018-08-18 ENCOUNTER — Other Ambulatory Visit: Payer: Self-pay | Admitting: Family Medicine

## 2018-08-18 DIAGNOSIS — I10 Essential (primary) hypertension: Secondary | ICD-10-CM

## 2018-09-01 ENCOUNTER — Other Ambulatory Visit: Payer: Self-pay | Admitting: Family Medicine

## 2018-09-01 DIAGNOSIS — R3914 Feeling of incomplete bladder emptying: Principal | ICD-10-CM

## 2018-09-01 DIAGNOSIS — N401 Enlarged prostate with lower urinary tract symptoms: Secondary | ICD-10-CM

## 2018-09-04 ENCOUNTER — Other Ambulatory Visit: Payer: Self-pay | Admitting: Family Medicine

## 2018-12-17 ENCOUNTER — Other Ambulatory Visit: Payer: Self-pay

## 2018-12-17 DIAGNOSIS — Z20822 Contact with and (suspected) exposure to covid-19: Secondary | ICD-10-CM

## 2018-12-18 LAB — NOVEL CORONAVIRUS, NAA: SARS-CoV-2, NAA: NOT DETECTED

## 2019-06-15 ENCOUNTER — Other Ambulatory Visit: Payer: Self-pay | Admitting: Family Medicine

## 2019-06-15 DIAGNOSIS — I10 Essential (primary) hypertension: Secondary | ICD-10-CM

## 2019-06-23 ENCOUNTER — Other Ambulatory Visit: Payer: Self-pay | Admitting: Family Medicine

## 2019-06-23 DIAGNOSIS — R3914 Feeling of incomplete bladder emptying: Secondary | ICD-10-CM

## 2019-06-23 DIAGNOSIS — N401 Enlarged prostate with lower urinary tract symptoms: Secondary | ICD-10-CM

## 2019-06-28 ENCOUNTER — Ambulatory Visit (INDEPENDENT_AMBULATORY_CARE_PROVIDER_SITE_OTHER): Payer: Medicare Other | Admitting: Family Medicine

## 2019-06-28 ENCOUNTER — Encounter: Payer: Self-pay | Admitting: Family Medicine

## 2019-06-28 ENCOUNTER — Other Ambulatory Visit: Payer: Self-pay

## 2019-06-28 VITALS — BP 110/70 | HR 84 | Temp 96.4°F | Ht 72.5 in | Wt 200.5 lb

## 2019-06-28 DIAGNOSIS — H9192 Unspecified hearing loss, left ear: Secondary | ICD-10-CM

## 2019-06-28 DIAGNOSIS — K640 First degree hemorrhoids: Secondary | ICD-10-CM

## 2019-06-28 DIAGNOSIS — E785 Hyperlipidemia, unspecified: Secondary | ICD-10-CM | POA: Diagnosis not present

## 2019-06-28 DIAGNOSIS — I10 Essential (primary) hypertension: Secondary | ICD-10-CM | POA: Diagnosis not present

## 2019-06-28 DIAGNOSIS — N401 Enlarged prostate with lower urinary tract symptoms: Secondary | ICD-10-CM

## 2019-06-28 DIAGNOSIS — Z8669 Personal history of other diseases of the nervous system and sense organs: Secondary | ICD-10-CM

## 2019-06-28 DIAGNOSIS — R3914 Feeling of incomplete bladder emptying: Secondary | ICD-10-CM

## 2019-06-28 DIAGNOSIS — Z131 Encounter for screening for diabetes mellitus: Secondary | ICD-10-CM | POA: Diagnosis not present

## 2019-06-28 LAB — POCT URINALYSIS DIP (PROADVANTAGE DEVICE)
Bilirubin, UA: NEGATIVE
Blood, UA: NEGATIVE
Glucose, UA: NEGATIVE mg/dL
Ketones, POC UA: NEGATIVE mg/dL
Leukocytes, UA: NEGATIVE
Nitrite, UA: NEGATIVE
Protein Ur, POC: NEGATIVE mg/dL
Specific Gravity, Urine: 1.015
Urobilinogen, Ur: 0.2
pH, UA: 6 (ref 5.0–8.0)

## 2019-06-28 MED ORDER — DOXAZOSIN MESYLATE 4 MG PO TABS: 4.0000 mg | ORAL_TABLET | Freq: Every day | ORAL | 3 refills | Status: DC

## 2019-06-28 MED ORDER — LISINOPRIL-HYDROCHLOROTHIAZIDE 10-12.5 MG PO TABS: 1.0000 | ORAL_TABLET | Freq: Every day | ORAL | 3 refills | Status: DC

## 2019-06-28 MED ORDER — FINASTERIDE 5 MG PO TABS: 5.0000 mg | ORAL_TABLET | Freq: Every day | ORAL | 3 refills | Status: DC

## 2019-06-28 NOTE — Patient Instructions (Signed)
  Jacob Bowen , Thank you for taking time to come for your Medicare Wellness Visit. I appreciate your ongoing commitment to your health goals. Please review the following plan we discussed and let me know if I can assist you in the future.   These are the goals we discussed: See ENT and your eye doctor  This is a list of the screening recommended for you and due dates:  Health Maintenance  Topic Date Due  . Tetanus Vaccine  04/30/1962  . Pneumonia vaccines (2 of 2 - PPSV23) 05/09/2015  . Flu Shot  12/04/2018

## 2019-06-28 NOTE — Progress Notes (Signed)
Jacob Bowen is a 79 y.o. male who presents for annual wellness visit and follow-up on chronic medical conditions.  He has the following concerns: Several days ago he noted a very loud noise and after that was unable to hear from his left ear.  He has no trouble with his right ear.  He also has a previous history of difficulty with an eye problem exactly the kind is unknown.  He was taken care of by Dr. Lucita Bowen for this but will need to see an ophthalmologist before being referred back there.  He also has a history of elevated PSA but has been seen by urology in the past and at the present time no intervention was needed.  He does have BPH and is responding nicely to finasteride and doxazosin he continues on his blood pressure medication.  He does have a previous history of hemorrhoids but no trouble recently.  He is now retired and enjoying it.  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  Social and family history was reviewed   Immunizations and Health Maintenance Immunization History  Administered Date(s) Administered  . DTaP 06/02/1994  . Influenza Split 04/11/2011  . Influenza Whole 05/17/2008, 05/18/2009  . Influenza, High Dose Seasonal PF 05/08/2014, 05/21/2015, 06/06/2016, 05/24/2018  . Influenza-Unspecified 06/20/2017, 05/24/2018  . Pneumococcal Conjugate-13 05/08/2014  . Pneumococcal Polysaccharide-23 05/20/2006  . Zoster 05/20/2006   Health Maintenance Due  Topic Date Due  . TETANUS/TDAP  04/30/1962  . PNA vac Low Risk Adult (2 of 2 - PPSV23) 05/09/2015  . INFLUENZA VACCINE  12/04/2018    Last colonoscopy: 03/22/2007 Last PSA: 06/06/16 Dentist:  07/04/2019 next appt  Ophtho: 2016 Exercise: weights working, bike   Other doctors caring for patient include: N/A  Advanced Directives: none on file info given Does Patient Have a Medical Advance Directive?: No Would patient like information on creating a medical  advance directive?: Yes (MAU/Ambulatory/Procedural Areas - Information given)  Depression screen:  See questionnaire below.     Depression screen [facility] 2/9 06/28/2019 06/24/2018 06/22/2017 06/06/2016 05/21/2015  Decreased Interest 0 0 0 0 0  Down, Depressed, Hopeless 0 0 - 0 0  PHQ - 2 Score 0 0 0 0 0    Fall Screen: See Questionaire below.   Fall Risk  06/28/2019 06/24/2018 06/22/2017 06/06/2016 05/21/2015  Falls in the past year? 0 0 No Yes No  Comment - - - slipped on ice -  Number falls in past yr: - - - 1 -  Injury with Fall? - - - No -    ADL screen:  See questionnaire below.  Functional Status Survey: Is the patient deaf or have difficulty hearing?: Yes(left ear) Does the patient have difficulty seeing, even when wearing glasses/contacts?: No Does the patient have difficulty concentrating, remembering, or making decisions?: No Does the patient have difficulty walking or climbing stairs?: No Does the patient have difficulty dressing or bathing?: No Does the patient have difficulty doing errands alone such as visiting a doctor's office or shopping?: No   Review of Systems  Constitutional: -, -unexpected weight change, -anorexia, -fatigue Allergy: -sneezing, -itching, -congestion Dermatology: denies changing moles, rash, lumps ENT: -runny nose, -ear pain, -sore throat,  Cardiology:  -chest pain, -palpitations, -orthopnea, Respiratory: -cough, -shortness of breath, -dyspnea on exertion, -wheezing,  Gastroenterology: -abdominal pain, -nausea, -vomiting, -diarrhea, -constipation, -dysphagia Hematology: -bleeding or bruising problems Musculoskeletal: -arthralgias, -myalgias, -joint swelling, -back pain, - Ophthalmology: -  vision changes,  Urology: -dysuria, -difficulty urinating,  -urinary frequency, -urgency, incontinence Neurology: -, -numbness, , -memory loss, -falls, -dizziness    PHYSICAL EXAM:  BP 110/70 (BP Location: Left Arm, Patient Position: Sitting)   Pulse 84   Temp (!)  96.4 F (35.8 C)   Ht 6' 0.5" (1.842 m)   Wt 200 lb 8 oz (90.9 kg)   SpO2 96%   BMI 26.82 kg/m   General Appearance: Alert, cooperative, no distress, appears stated age Head: Normocephalic, without obvious abnormality, atraumatic Eyes: PERRL, conjunctiva/corneas clear, EOM's intact Ears: Normal TM's and external ear canals Nose: Nares normal, mucosa normal, no drainage or sinus   tenderness Throat: Lips, mucosa, and tongue normal; teeth and gums normal Neck: Supple, no lymphadenopathy, thyroid:no enlargement/tenderness/nodules; no carotid bruit or JVD Lungs: Clear to auscultation bilaterally without wheezes, rales or ronchi; respirations unlabored Heart: Regular rate and rhythm, S1 and S2 normal, no murmur, rub or gallop Abdomen: Soft, non-tender, nondistended, normoactive bowel sounds, no masses, no hepatosplenomegaly Skin: Skin color, texture, turgor normal, no rashes or lesions Lymph nodes: Cervical, supraclavicular, and axillary nodes normal Neurologic: CNII-XII intact, normal strength, sensation and gait; reflexes 2+ and symmetric throughout   Psych: Normal mood, affect, hygiene and grooming  ASSESSMENT/PLAN: Hearing loss of left ear, unspecified hearing loss type - Plan: Ambulatory referral to ENT  Hyperlipidemia LDL goal <130 - Plan: Lipid panel  Essential hypertension - Plan: CBC with Differential/Platelet, Comprehensive metabolic panel, POCT Urinalysis DIP (Proadvantage Device), lisinopril-hydrochlorothiazide (ZESTORETIC) 10-12.5 MG tablet  Grade I hemorrhoids  Benign prostatic hyperplasia with incomplete bladder emptying - Plan: doxazosin (CARDURA) 4 MG tablet, finasteride (PROSCAR) 5 MG tablet  History of eye problem - Plan: Ambulatory referral to Ophthalmology Recommend that he get the Shingrix vaccine as well as Tdap and take a multivitamin. . Immunization recommendations discussed.  Colonoscopy recommendations reviewed.   Medicare Attestation I have personally  reviewed: The patient's medical and social history Their use of alcohol, tobacco or illicit drugs Their current medications and supplements The patient's functional ability including ADLs,fall risks, home safety risks, cognitive, and hearing and visual impairment Diet and physical activities Evidence for depression or mood disorders  The patient's weight, height, and BMI have been recorded in the chart.  I have made referrals, counseling, and provided education to the patient based on review of the above and I have provided the patient with a written personalized care plan for preventive services.     Sharlot Gowda, MD   06/28/2019

## 2019-06-29 LAB — LIPID PANEL
Chol/HDL Ratio: 5 ratio (ref 0.0–5.0)
Cholesterol, Total: 235 mg/dL — ABNORMAL HIGH (ref 100–199)
HDL: 47 mg/dL (ref >39)
LDL Chol Calc (NIH): 163 mg/dL — ABNORMAL HIGH (ref 0–99)
Triglycerides: 136 mg/dL (ref 0–149)
VLDL Cholesterol Cal: 25 mg/dL (ref 5–40)

## 2019-06-29 LAB — CBC WITH DIFFERENTIAL/PLATELET
Basophils Absolute: 0 10*3/uL (ref 0.0–0.2)
Basos: 0 %
EOS (ABSOLUTE): 0.1 10*3/uL (ref 0.0–0.4)
Eos: 1 %
Hematocrit: 42.5 % (ref 37.5–51.0)
Hemoglobin: 14.6 g/dL (ref 13.0–17.7)
Immature Grans (Abs): 0 10*3/uL (ref 0.0–0.1)
Immature Granulocytes: 0 %
Lymphocytes Absolute: 1.4 10*3/uL (ref 0.7–3.1)
Lymphs: 20 %
MCH: 31.2 pg (ref 26.6–33.0)
MCHC: 34.4 g/dL (ref 31.5–35.7)
MCV: 91 fL (ref 79–97)
Monocytes Absolute: 0.7 10*3/uL (ref 0.1–0.9)
Monocytes: 10 %
Neutrophils Absolute: 5 10*3/uL (ref 1.4–7.0)
Neutrophils: 69 %
Platelets: 238 10*3/uL (ref 150–450)
RBC: 4.68 x10E6/uL (ref 4.14–5.80)
RDW: 12.6 % (ref 11.6–15.4)
WBC: 7.2 10*3/uL (ref 3.4–10.8)

## 2019-06-29 LAB — COMPREHENSIVE METABOLIC PANEL
ALT: 13 IU/L (ref 0–44)
AST: 15 IU/L (ref 0–40)
Albumin/Globulin Ratio: 1.6 (ref 1.2–2.2)
Albumin: 4.7 g/dL (ref 3.7–4.7)
Alkaline Phosphatase: 96 IU/L (ref 39–117)
BUN/Creatinine Ratio: 15 (ref 10–24)
BUN: 14 mg/dL (ref 8–27)
Bilirubin Total: 0.2 mg/dL (ref 0.0–1.2)
CO2: 23 mmol/L (ref 20–29)
Calcium: 9.7 mg/dL (ref 8.6–10.2)
Chloride: 103 mmol/L (ref 96–106)
Creatinine, Ser: 0.95 mg/dL (ref 0.76–1.27)
GFR calc Af Amer: 90 mL/min/{1.73_m2} (ref >59)
GFR calc non Af Amer: 77 mL/min/{1.73_m2} (ref >59)
Globulin, Total: 3 g/dL (ref 1.5–4.5)
Glucose: 119 mg/dL — ABNORMAL HIGH (ref 65–99)
Potassium: 4.2 mmol/L (ref 3.5–5.2)
Sodium: 141 mmol/L (ref 134–144)
Total Protein: 7.7 g/dL (ref 6.0–8.5)

## 2019-06-30 ENCOUNTER — Ambulatory Visit: Payer: Medicare Other | Attending: Internal Medicine

## 2019-06-30 DIAGNOSIS — Z23 Encounter for immunization: Secondary | ICD-10-CM

## 2019-06-30 LAB — HGB A1C W/O EAG: Hgb A1c MFr Bld: 5.6 % (ref 4.8–5.6)

## 2019-06-30 LAB — SPECIMEN STATUS REPORT

## 2019-06-30 NOTE — Progress Notes (Signed)
   Covid-19 Vaccination Clinic  Name:  Jacob Bowen    MRN: [id number] DOB: 04/03/1941  06/30/2019  [name] was observed post Covid-19 immunization for 15 minutes without incidence. He was provided with Vaccine Information Sheet and instruction to access the V-Safe system.   [name] was instructed to call 911 with any severe reactions post vaccine: Marland Kitchen Difficulty breathing  . Swelling of your face and throat  . A fast heartbeat  . A bad rash all over your body  . Dizziness and weakness    Immunizations Administered    Name Date Dose VIS Date Route   Pfizer COVID-19 Vaccine 06/30/2019  2:52 PM 0.3 mL 04/15/2019 Intramuscular   Manufacturer: [employer], [employer]   Lot: [id number]   NDC: [address]-1

## 2019-07-07 ENCOUNTER — Other Ambulatory Visit: Payer: Self-pay

## 2019-07-07 ENCOUNTER — Ambulatory Visit (INDEPENDENT_AMBULATORY_CARE_PROVIDER_SITE_OTHER): Payer: Medicare Other | Admitting: Otolaryngology

## 2019-07-07 VITALS — Temp 98.1°F

## 2019-07-07 DIAGNOSIS — H9120 Sudden idiopathic hearing loss, unspecified ear: Secondary | ICD-10-CM

## 2019-07-07 DIAGNOSIS — H6121 Impacted cerumen, right ear: Secondary | ICD-10-CM

## 2019-07-07 DIAGNOSIS — H903 Sensorineural hearing loss, bilateral: Secondary | ICD-10-CM | POA: Diagnosis not present

## 2019-07-07 DIAGNOSIS — H9122 Sudden idiopathic hearing loss, left ear: Secondary | ICD-10-CM | POA: Diagnosis not present

## 2019-07-07 NOTE — Progress Notes (Signed)
HPI: Jacob Bowen is a 79 y.o. male who presents for evaluation of hearing loss.  He noted about 6 weeks ago that he lost hearing in his left ear suddenly but did not see anybody about this and has noticed diminished hearing in the right ear.  Denies any ear pain or trauma to the ear.  Prior to the hearing loss he felt like he heard normal in the left ear.  He has had no previous hearing test.  He does not hear much out of the left ear and has diminished hearing in the right ear.  Past Medical History:  Diagnosis Date  . Femoral hernia, bilateral, s/p lap repair 04/13/2012  . Hemorrhoids    EXTERNAL  . Hypertension    Past Surgical History:  Procedure Laterality Date  . COLONOSCOPY    . COLONOSCOPY W/ POLYPECTOMY     Social History   Socioeconomic History  . Marital status: Divorced    Spouse name: Not on file  . Number of children: Not on file  . Years of education: Not on file  . Highest education level: Not on file  Occupational History  . Not on file  Tobacco Use  . Smoking status: Never Smoker  . Smokeless tobacco: Never Used  Substance and Sexual Activity  . Alcohol use: No    Comment: rare  . Drug use: Yes    Types: Marijuana  . Sexual activity: Not Currently  Other Topics Concern  . 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 Difficulty of Paying Living Expenses: Not on file  Food Insecurity:   . Worried About Charity fundraiser in the Last Year: Not on file  . Ran Out of Food in the Last Year: Not on file  Transportation Needs:   . Lack of Transportation (Medical): Not on file  . Lack of Transportation (Non-Medical): Not on file  Physical Activity:   . Days of Exercise per Week: Not on file  . Minutes of Exercise per Session: Not on file  Stress:   . Feeling of Stress : Not on file  Social  Connections:   . Frequency of Communication with Friends and Family: Not on file  . Frequency of Social Gatherings with Friends and Family: Not on file  . Attends Religious Services: Not on file  . Active Member of Clubs or Organizations: Not on file  . Attends Archivist Meetings: Not on file  . Marital Status: Not on file   Family History  Problem Relation Age of Onset  . Hypertension Father    No Known Allergies Prior to Admission medications   Medication Sig Start Date End Date Taking? Authorizing Provider  doxazosin (CARDURA) 4 MG tablet Take 1 tablet (4 mg total) by mouth daily. 06/28/19  Yes Denita Lung, MD  finasteride (PROSCAR) 5 MG tablet Take 1 tablet (5 mg total) by mouth daily. 06/28/19  Yes Denita Lung, MD  lisinopril-hydrochlorothiazide (ZESTORETIC) 10-12.5 MG tablet Take 1 tablet by mouth daily. 06/28/19  Yes Denita Lung, MD     Positive ROS: Otherwise negative  All other systems have been reviewed and were otherwise negative with the exception of those mentioned in the HPI and as above.  Physical Exam: Constitutional: Alert, well-appearing, no acute distress Ears: External  ears without lesions or tenderness.  His right ear canal is completely occluded with cerumen that was removed in the office using curette and forceps.  The TM was otherwise clear.  The left ear canal has minimal cerumen that was removed with a curette.  The left TM was clear. Nasal: External nose without lesions.. Clear nasal passages Oral: Lips and gums without lesions. Tongue and palate mucosa without lesions. Posterior oropharynx clear. Neck: No palpable adenopathy or masses Respiratory: Breathing comfortably  Skin: No facial/neck lesions or rash noted.  Cerumen impaction removal  Date/Time: 07/07/2019 2:33 PM Performed by: Drema Halon, MD Authorized by: Drema Halon, MD   Consent:    Consent obtained:  Verbal   Consent given by:  Patient   Risks  discussed:  Pain and bleeding Procedure details:    Location:  L ear and R ear   Procedure type: curette and forceps   Post-procedure details:    Inspection:  TM intact and canal normal   Hearing quality:  Improved   Patient tolerance of procedure:  Tolerated well, no immediate complications Comments:     The right ear canal was impacted with cerumen.  There is minimal cerumen on the left side.  TMs were clear bilaterally.    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 He will follow-up in 2 weeks for recheck and audiologic testing. At that point we will discuss on options for possible amplification if warranted.  Narda Bonds, MD

## 2019-07-08 ENCOUNTER — Encounter (INDEPENDENT_AMBULATORY_CARE_PROVIDER_SITE_OTHER): Payer: Self-pay

## 2019-07-20 ENCOUNTER — Telehealth: Payer: Self-pay

## 2019-07-20 NOTE — Telephone Encounter (Signed)
Spoke to pt and advise him that [facility] has been trying to contact him to schedule . Pt was given their office phone number to call back. KH

## 2019-07-25 ENCOUNTER — Other Ambulatory Visit: Payer: Self-pay

## 2019-07-25 ENCOUNTER — Ambulatory Visit (INDEPENDENT_AMBULATORY_CARE_PROVIDER_SITE_OTHER): Payer: Medicare Other | Admitting: Otolaryngology

## 2019-07-25 ENCOUNTER — Encounter (INDEPENDENT_AMBULATORY_CARE_PROVIDER_SITE_OTHER): Payer: Self-pay | Admitting: Otolaryngology

## 2019-07-25 VITALS — Temp 97.9°F

## 2019-07-25 DIAGNOSIS — H9120 Sudden idiopathic hearing loss, unspecified ear: Secondary | ICD-10-CM | POA: Diagnosis not present

## 2019-07-25 DIAGNOSIS — H90A21 Sensorineural hearing loss, unilateral, right ear, with restricted hearing on the contralateral side: Secondary | ICD-10-CM | POA: Diagnosis not present

## 2019-07-25 DIAGNOSIS — H9122 Sudden idiopathic hearing loss, left ear: Secondary | ICD-10-CM | POA: Diagnosis not present

## 2019-07-25 NOTE — Progress Notes (Signed)
HPI: Jacob Bowen is a 79 y.o. male who returns today for evaluation of sudden left ear SNHL.  This initially occurred a little over 2 months ago.  He has just recently completed a 2-week course of steroids and on repeat audiologic testing today demonstrated no significant improvement in his hearing.  He is having no balance problems and no headaches..  Past Medical History:  Diagnosis Date  . Femoral hernia, bilateral, s/p lap repair 04/13/2012  . Hemorrhoids    EXTERNAL  . Hypertension    Past Surgical History:  Procedure Laterality Date  . COLONOSCOPY    . COLONOSCOPY W/ POLYPECTOMY     Social History   Socioeconomic History  . Marital status: Divorced    Spouse name: Not on file  . Number of children: Not on file  . Years of education: Not on file  . Highest education level: Not on file  Occupational History  . Not on file  Tobacco Use  . Smoking status: Never Smoker  . Smokeless tobacco: Never Used  Substance and Sexual Activity  . Alcohol use: No    Comment: rare  . Drug use: Yes    Types: Marijuana  . Sexual activity: Not Currently  Other Topics Concern  . 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 Difficulty of Paying Living Expenses:   Food Insecurity:   . Worried About Charity fundraiser in the Last Year:   . Arboriculturist in the Last Year:   Transportation Needs:   . Film/video editor (Medical):   Marland Kitchen Lack of Transportation (Non-Medical):   Physical Activity:   . Days of Exercise per Week:   . Minutes of Exercise per Session:   Stress:   . Feeling of Stress :   Social Connections:   . Frequency of Communication with Friends and Family:   . Frequency of Social Gatherings with Friends and Family:   . Attends Religious Services:   . Active Member of Clubs or Organizations:   . Attends  Archivist Meetings:   Marland Kitchen Marital Status:    Family History  Problem Relation Age of Onset  . Hypertension Father    No Known Allergies Prior to Admission medications   Medication Sig Start Date End Date Taking? Authorizing Provider  doxazosin (CARDURA) 4 MG tablet Take 1 tablet (4 mg total) by mouth daily. 06/28/19   Denita Lung, MD  finasteride (PROSCAR) 5 MG tablet Take 1 tablet (5 mg total) by mouth daily. 06/28/19   Denita Lung, MD  lisinopril-hydrochlorothiazide (ZESTORETIC) 10-12.5 MG tablet Take 1 tablet by mouth daily. 06/28/19   Denita Lung, MD     Positive ROS: Otherwise negative  All other systems have been reviewed and were otherwise negative with the exception of those mentioned in the HPI and as above.  Physical Exam: Constitutional: Alert, well-appearing, no acute distress Ears: External ears without lesions or tenderness. Ear canals are clear bilaterally with intact, clear TMs.  Nasal: External nose without lesions.. Clear nasal passages Oral: Lips and gums without lesions. Tongue and palate mucosa without lesions. Posterior oropharynx clear. Neck: No palpable adenopathy or masses Respiratory: Breathing comfortably  Skin: No facial/neck lesions or rash noted.  Procedures  Assessment: Left ear sudden sensorineural hearing loss.  Not responsive to steroids.  Plan: Recommended evaluation for hearing aids and will follow up with hearing life.   Narda Bonds, MD

## 2019-07-26 ENCOUNTER — Ambulatory Visit: Payer: Medicare Other | Attending: Internal Medicine

## 2019-07-26 DIAGNOSIS — Z23 Encounter for immunization: Secondary | ICD-10-CM

## 2019-07-26 NOTE — Progress Notes (Signed)
   Covid-19 Vaccination Clinic  Name:  Jacob Bowen    MRN: [id number] DOB: 1941/01/10  07/26/2019  Mr. Jacob Bowen was observed post Covid-19 immunization for 15 minutes without incident. He was provided with Vaccine Information Sheet and instruction to access the V-Safe system.   Jacob Bowen was instructed to call 911 with any severe reactions post vaccine: Marland Kitchen Difficulty breathing  . Swelling of face and throat  . A fast heartbeat  . A bad rash all over body  . Dizziness and weakness   Immunizations Administered    Name Date Dose VIS Date Route   Pfizer COVID-19 Vaccine 07/26/2019  1:04 PM 0.3 mL 04/15/2019 Intramuscular   Manufacturer: [employer], [employer]   Lot: [id number]   NDC: [address]-4

## 2019-08-01 ENCOUNTER — Encounter (INDEPENDENT_AMBULATORY_CARE_PROVIDER_SITE_OTHER): Payer: Self-pay

## 2019-08-04 DIAGNOSIS — Z961 Presence of intraocular lens: Secondary | ICD-10-CM | POA: Diagnosis not present

## 2019-08-04 DIAGNOSIS — H26493 Other secondary cataract, bilateral: Secondary | ICD-10-CM | POA: Diagnosis not present

## 2019-08-15 DIAGNOSIS — H905 Unspecified sensorineural hearing loss: Secondary | ICD-10-CM | POA: Diagnosis not present

## 2019-09-21 ENCOUNTER — Other Ambulatory Visit: Payer: Self-pay | Admitting: Family Medicine

## 2019-09-21 DIAGNOSIS — I10 Essential (primary) hypertension: Secondary | ICD-10-CM

## 2020-03-03 ENCOUNTER — Ambulatory Visit: Payer: Medicare Other | Attending: Internal Medicine

## 2020-03-03 ENCOUNTER — Other Ambulatory Visit: Payer: Self-pay

## 2020-03-03 DIAGNOSIS — Z23 Encounter for immunization: Secondary | ICD-10-CM

## 2020-03-03 NOTE — Progress Notes (Signed)
   Covid-19 Vaccination Clinic  Name:  RAMELO OETKEN    MRN: [id number] DOB: 04/25/1941  03/03/2020  Mr. Gitlin was observed post Covid-19 immunization for 15 minutes without incident. He was provided with Vaccine Information Sheet and instruction to access the V-Safe system.   Mr. Vanwieren was instructed to call 911 with any severe reactions post vaccine: Marland Kitchen Difficulty breathing  . Swelling of face and throat  . A fast heartbeat  . A bad rash all over body  . Dizziness and weakness

## 2020-06-28 ENCOUNTER — Ambulatory Visit: Payer: Medicare Other | Admitting: Family Medicine

## 2020-07-02 ENCOUNTER — Ambulatory Visit: Payer: Medicare Other | Admitting: Family Medicine

## 2020-07-03 ENCOUNTER — Ambulatory Visit (INDEPENDENT_AMBULATORY_CARE_PROVIDER_SITE_OTHER): Payer: Medicare Other | Admitting: Family Medicine

## 2020-07-03 ENCOUNTER — Encounter: Payer: Self-pay | Admitting: Family Medicine

## 2020-07-03 ENCOUNTER — Other Ambulatory Visit: Payer: Self-pay

## 2020-07-03 VITALS — BP 130/82 | HR 73 | Temp 97.7°F | Ht 73.0 in | Wt 195.2 lb

## 2020-07-03 DIAGNOSIS — I714 Abdominal aortic aneurysm, without rupture, unspecified: Secondary | ICD-10-CM

## 2020-07-03 DIAGNOSIS — Z136 Encounter for screening for cardiovascular disorders: Secondary | ICD-10-CM

## 2020-07-03 DIAGNOSIS — Z0000 Encounter for general adult medical examination without abnormal findings: Secondary | ICD-10-CM | POA: Diagnosis not present

## 2020-07-03 DIAGNOSIS — E785 Hyperlipidemia, unspecified: Secondary | ICD-10-CM | POA: Diagnosis not present

## 2020-07-03 DIAGNOSIS — I10 Essential (primary) hypertension: Secondary | ICD-10-CM

## 2020-07-03 DIAGNOSIS — Z8371 Family history of colonic polyps: Secondary | ICD-10-CM

## 2020-07-03 DIAGNOSIS — R3914 Feeling of incomplete bladder emptying: Secondary | ICD-10-CM

## 2020-07-03 DIAGNOSIS — H9112 Presbycusis, left ear: Secondary | ICD-10-CM | POA: Diagnosis not present

## 2020-07-03 DIAGNOSIS — N400 Enlarged prostate without lower urinary tract symptoms: Secondary | ICD-10-CM

## 2020-07-03 DIAGNOSIS — Z1159 Encounter for screening for other viral diseases: Secondary | ICD-10-CM

## 2020-07-03 DIAGNOSIS — N401 Enlarged prostate with lower urinary tract symptoms: Secondary | ICD-10-CM

## 2020-07-03 DIAGNOSIS — Z13220 Encounter for screening for lipoid disorders: Secondary | ICD-10-CM

## 2020-07-03 MED ORDER — LISINOPRIL-HYDROCHLOROTHIAZIDE 10-12.5 MG PO TABS: 1.0000 | ORAL_TABLET | Freq: Every day | ORAL | 3 refills | Status: DC

## 2020-07-03 MED ORDER — DOXAZOSIN MESYLATE 4 MG PO TABS: 4.0000 mg | ORAL_TABLET | Freq: Every day | ORAL | 3 refills | Status: DC

## 2020-07-03 MED ORDER — FINASTERIDE 5 MG PO TABS: 5.0000 mg | ORAL_TABLET | Freq: Every day | ORAL | 3 refills | Status: DC

## 2020-07-03 NOTE — Progress Notes (Signed)
Jacob Bowen is a 80 y.o. male who presents for annual wellness visit and follow-up on chronic medical conditions.  He has no particular concerns or complaints.  He continues on lisinopril and Cardura.  He is also taking finasteride and having no difficulty with him.  Review of his record indicates he needs follow-up on abdominal aorta.  He continues to drive competitively but does plan on only driving once this year.  He does have hearing aids but does not always wear them.  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  Review of the record also indicates the need for Tdap.   Immunizations and Health Maintenance Immunization History  Administered Date(s) Administered  . DTaP 06/02/1994  . Influenza Split 04/11/2011  . Influenza Whole 05/17/2008, 05/18/2009  . Influenza, High Dose Seasonal PF 05/08/2014, 05/21/2015, 06/06/2016, 05/24/2018  . Influenza-Unspecified 06/20/2017, 05/24/2018  . PFIZER(Purple Top)SARS-COV-2 Vaccination 06/30/2019, 07/26/2019, 03/03/2020  . Pneumococcal Conjugate-13 05/08/2014  . Pneumococcal Polysaccharide-23 05/20/2006  . Zoster 05/20/2006  . Zoster Recombinat (Shingrix) 12/28/2019   Health Maintenance Due  Topic Date Due  . Hepatitis C Screening  Never done  . TETANUS/TDAP  Never done  . PNA vac Low Risk Adult (2 of 2 - PPSV23) 05/09/2015  . INFLUENZA VACCINE  12/04/2019    Last colonoscopy: 03/22/2007 Last PSA: 06/06/16 Dentist:Q  Six months  Ophtho: Q year Exercise: job work out   Tourist information centre manager for patient include: Dr. Dione Booze eye, Dr. Memory Argue hearing, Dr. Brunilda Payor urology  Advanced Directives: Does Patient Have a Medical Advance Directive?: No Would patient like information on creating a medical advance directive?: Yes (MAU/Ambulatory/Procedural Areas - Information given)  Depression screen:  See questionnaire below.     Depression screen [facility] 2/9 07/03/2020 06/28/2019 06/24/2018 06/22/2017 06/06/2016  Decreased Interest 0 0 0 0 0  Down,  Depressed, Hopeless 0 0 0 - 0  PHQ - 2 Score 0 0 0 0 0    Fall Screen: See Questionaire below.   Fall Risk  07/03/2020 06/28/2019 06/24/2018 06/22/2017 06/06/2016  Falls in the past year? 0 0 0 No Yes  Comment - - - - slipped on ice  Number falls in past yr: 0 - - - 1  Injury with Fall? 0 - - - No  Risk for fall due to : No Fall Risks - - - -  Follow up Falls evaluation completed - - - -    ADL screen:  See questionnaire below.  Functional Status Survey: Is the patient deaf or have difficulty hearing?: Yes Does the patient have difficulty seeing, even when wearing glasses/contacts?: No Does the patient have difficulty concentrating, remembering, or making decisions?: No Does the patient have difficulty walking or climbing stairs?: No Does the patient have difficulty dressing or bathing?: No Does the patient have difficulty doing errands alone such as visiting a doctor's office or shopping?: No   Review of Systems  Constitutional: -, -unexpected weight change, -anorexia, -fatigue Allergy: -sneezing, -itching, -congestion Dermatology: denies changing moles, rash, lumps ENT: -runny nose, -ear pain, -sore throat,  Cardiology:  -chest pain, -palpitations, -orthopnea, Respiratory: -cough, -shortness of breath, -dyspnea on exertion, -wheezing,  Gastroenterology: -abdominal pain, -nausea, -vomiting, -diarrhea, -constipation, -dysphagia Hematology: -bleeding or bruising problems Musculoskeletal: -arthralgias, -myalgias, -joint swelling, -back pain, - Ophthalmology: -vision changes,  Urology: -dysuria, -difficulty urinating,  -urinary frequency, -urgency, incontinence Neurology: -, -numbness, , -memory loss, -falls, -dizziness    PHYSICAL EXAM:   General Appearance: Alert, cooperative, no distress, appears  stated age Head: Normocephalic, without obvious abnormality, atraumatic Eyes: PERRL, conjunctiva/corneas clear, EOM's intact,  Ears: Normal TM's and external ear canals Nose: Nares  normal, mucosa normal, no drainage or sinus   tenderness Throat: Lips, mucosa, and tongue normal; teeth and gums normal Neck: Supple, no lymphadenopathy, thyroid:no enlargement/tenderness/nodules; no carotid bruit or JVD Lungs: Clear to auscultation bilaterally without wheezes, rales or ronchi; respirations unlabored Heart: Regular rate and rhythm, S1 and S2 normal, no murmur, rub or gallop Abdomen: Soft, non-tender, nondistended, normoactive bowel sounds, no masses, no hepatosplenomegaly Extremities: No clubbing, cyanosis or edema Pulses: 2+ and symmetric all extremities Skin: Skin color, texture, turgor normal, no rashes or lesions Lymph nodes: Cervical, supraclavicular, and axillary nodes normal Neurologic: CNII-XII intact, normal strength, sensation and gait; reflexes 2+ and symmetric throughout   Psych: Normal mood, affect, hygiene and grooming  ASSESSMENT/PLAN: Routine general medical examination at a health care facility  Hyperlipidemia LDL goal <130 - Plan: CBC with Differential/Platelet, Comprehensive metabolic panel, Lipid panel  Essential hypertension - Plan: lisinopril-hydrochlorothiazide (ZESTORETIC) 10-12.5 MG tablet  Benign prostatic hyperplasia with incomplete bladder emptying - Plan: finasteride (PROSCAR) 5 MG tablet, doxazosin (CARDURA) 4 MG tablet  FH: colonic polyps  Screening for lipoid disorders - Plan: Lipid panel  Need for hepatitis C screening test - Plan: Hepatitis C antibody  Screening for AAA (abdominal aortic aneurysm) - Plan: US AORTA  Benign prostatic hyperplasia without lower urinary tract symptoms  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  I will send him off for ultrasound on his aorta to follow-up on his previous reading.  No intervention right now for the family history of colonic polyps Immunization recommendations discussed.  Recommend he go to the drugstore and get Tdap.   Colonoscopy recommendations reviewed.   Medicare Attestation I have personally reviewed: The patient's medical and social history Their use of alcohol, tobacco or illicit drugs Their current medications and supplements The patient's functional ability including ADLs,fall risks, home safety risks, cognitive, and hearing and visual impairment Diet and physical activities Evidence for depression or mood disorders  The patient's weight, height, and BMI have been recorded in the chart.  I have made referrals, counseling, and provided education to the patient based on review of the above and I have provided the patient with a written personalized care plan for preventive services.     Sharlot Gowda, MD   07/03/2020

## 2020-07-04 LAB — COMPREHENSIVE METABOLIC PANEL
ALT: 9 IU/L (ref 0–44)
AST: 13 IU/L (ref 0–40)
Albumin/Globulin Ratio: 1.8 (ref 1.2–2.2)
Albumin: 4.5 g/dL (ref 3.7–4.7)
Alkaline Phosphatase: 88 IU/L (ref 44–121)
BUN/Creatinine Ratio: 16 (ref 10–24)
BUN: 14 mg/dL (ref 8–27)
Bilirubin Total: <0.2 mg/dL (ref 0.0–1.2)
CO2: 22 mmol/L (ref 20–29)
Calcium: 9.3 mg/dL (ref 8.6–10.2)
Chloride: 102 mmol/L (ref 96–106)
Creatinine, Ser: 0.89 mg/dL (ref 0.76–1.27)
Globulin, Total: 2.5 g/dL (ref 1.5–4.5)
Glucose: 109 mg/dL — ABNORMAL HIGH (ref 65–99)
Potassium: 4.1 mmol/L (ref 3.5–5.2)
Sodium: 140 mmol/L (ref 134–144)
Total Protein: 7 g/dL (ref 6.0–8.5)
eGFR: 88 mL/min/{1.73_m2} (ref >59)

## 2020-07-04 LAB — CBC WITH DIFFERENTIAL/PLATELET
Basophils Absolute: 0 10*3/uL (ref 0.0–0.2)
Basos: 0 %
EOS (ABSOLUTE): 0.1 10*3/uL (ref 0.0–0.4)
Eos: 1 %
Hematocrit: 43.2 % (ref 37.5–51.0)
Hemoglobin: 14.4 g/dL (ref 13.0–17.7)
Immature Grans (Abs): 0 10*3/uL (ref 0.0–0.1)
Immature Granulocytes: 0 %
Lymphocytes Absolute: 1.9 10*3/uL (ref 0.7–3.1)
Lymphs: 25 %
MCH: 30.4 pg (ref 26.6–33.0)
MCHC: 33.3 g/dL (ref 31.5–35.7)
MCV: 91 fL (ref 79–97)
Monocytes Absolute: 0.7 10*3/uL (ref 0.1–0.9)
Monocytes: 10 %
Neutrophils Absolute: 5 10*3/uL (ref 1.4–7.0)
Neutrophils: 64 %
Platelets: 225 10*3/uL (ref 150–450)
RBC: 4.74 x10E6/uL (ref 4.14–5.80)
RDW: 12.4 % (ref 11.6–15.4)
WBC: 7.8 10*3/uL (ref 3.4–10.8)

## 2020-07-04 LAB — LIPID PANEL
Chol/HDL Ratio: 5.1 ratio — ABNORMAL HIGH (ref 0.0–5.0)
Cholesterol, Total: 219 mg/dL — ABNORMAL HIGH (ref 100–199)
HDL: 43 mg/dL (ref >39)
LDL Chol Calc (NIH): 144 mg/dL — ABNORMAL HIGH (ref 0–99)
Triglycerides: 178 mg/dL — ABNORMAL HIGH (ref 0–149)
VLDL Cholesterol Cal: 32 mg/dL (ref 5–40)

## 2020-07-04 LAB — HEPATITIS C ANTIBODY: Hep C Virus Ab: <0.1 s/co ratio (ref 0.0–0.9)

## 2020-07-18 ENCOUNTER — Ambulatory Visit
Admission: RE | Admit: 2020-07-18 | Discharge: 2020-07-18 | Disposition: A | Discharge status: Home / Self Care | Payer: Medicare Other | Source: Ambulatory Visit | Attending: Family Medicine | Admitting: Family Medicine

## 2020-07-18 DIAGNOSIS — I714 Abdominal aortic aneurysm, without rupture: Secondary | ICD-10-CM | POA: Diagnosis not present

## 2020-07-19 ENCOUNTER — Other Ambulatory Visit: Payer: Self-pay

## 2020-07-19 DIAGNOSIS — I714 Abdominal aortic aneurysm, without rupture, unspecified: Secondary | ICD-10-CM

## 2020-07-27 ENCOUNTER — Other Ambulatory Visit: Payer: Self-pay

## 2020-07-27 ENCOUNTER — Ambulatory Visit: Payer: Medicare Other | Admitting: Vascular Surgery

## 2020-07-27 ENCOUNTER — Encounter: Payer: Self-pay | Admitting: Vascular Surgery

## 2020-07-27 VITALS — BP 133/70 | HR 74 | Temp 98.3°F | Resp 20 | Ht 73.0 in | Wt 191.8 lb

## 2020-07-27 DIAGNOSIS — I714 Abdominal aortic aneurysm, without rupture, unspecified: Secondary | ICD-10-CM

## 2020-07-27 NOTE — Progress Notes (Signed)
Patient ID: Jacob Bowen, male   DOB: [birth year], 80 y.o.   MRN: [id number]  Reason for Consult: New Patient (Initial Visit)   Referred by Ronnald Nian, MD  Subjective:     HPI:  Jacob Bowen is a 80 y.o. male with known history of abdominal aortic aneurysm.  He was recently evaluated ultrasound with notable enlargement of his aneurysm since previous evaluation 2014.  He has no family history of aneurysm disease.  He is a lifelong non-smoker however he has frequently used marijuana in the past.  He is a retired Quarry manager.  He walks without limitation does not have any previous history of peripheral arterial disease.  Risk factors for vascular disease include hypertension.  He denies any history of stroke, TIA or amaurosis.  Past Medical History:  Diagnosis Date  . Femoral hernia, bilateral, s/p lap repair 04/13/2012  . Hemorrhoids    EXTERNAL  . Hypertension    Family History  Problem Relation Age of Onset  . Hypertension Father    Past Surgical History:  Procedure Laterality Date  . COLONOSCOPY    . COLONOSCOPY W/ POLYPECTOMY      Short Social History:  Social History   Tobacco Use  . Smoking status: Never Smoker  . Smokeless tobacco: Never Used  Substance Use Topics  . Alcohol use: No    Comment: rare    No Known Allergies  Current Outpatient Medications  Medication Sig Dispense Refill  . doxazosin (CARDURA) 4 MG tablet Take 1 tablet (4 mg total) by mouth daily. 90 tablet 3  . finasteride (PROSCAR) 5 MG tablet Take 1 tablet (5 mg total) by mouth daily. 90 tablet 3  . lisinopril-hydrochlorothiazide (ZESTORETIC) 10-12.5 MG tablet Take 1 tablet by mouth daily. 90 tablet 3   No current facility-administered medications for this visit.    Review of Systems  Constitutional:  Constitutional negative. HENT: HENT negative.  Eyes: Eyes negative.  Respiratory: Respiratory negative.  Cardiovascular: Cardiovascular negative.  GI: Gastrointestinal  negative.  Musculoskeletal: Musculoskeletal negative.  Skin: Skin negative.  Neurological: Neurological negative. Hematologic: Hematologic/lymphatic negative.  Psychiatric: Psychiatric negative.        Objective:  Objective   Vitals:   07/27/20 1426  BP: 133/70  Pulse: 74  Resp: 20  Temp: 98.3 F (36.8 C)  SpO2: 95%  Weight: 191 lb 12.8 oz (87 kg)  Height: 6\' 1"  (1.854 m)   Body mass index is 25.3 kg/m.  Physical Exam HENT:     Head: Normocephalic.     Nose:     Comments: Wearing a mask Eyes:     Extraocular Movements: Extraocular movements intact.     Pupils: Pupils are equal, round, and reactive to light.  Neck:     Vascular: No carotid bruit.  Cardiovascular:     Pulses:          Radial pulses are 2+ on the right side and 2+ on the left side.       Popliteal pulses are 3+ on the right side and 2+ on the left side.  Pulmonary:     Effort: Pulmonary effort is normal.     Breath sounds: Normal breath sounds.  Abdominal:     General: Abdomen is flat.     Palpations: Abdomen is soft. There is mass.  Musculoskeletal:        General: Normal range of motion.     Cervical back: Neck supple.     Right lower leg: No  edema.     Left lower leg: No edema.  Skin:    General: Skin is warm and dry.     Capillary Refill: Capillary refill takes less than 2 seconds.  Neurological:     General: No focal deficit present.     Mental Status: He is alert.  Psychiatric:        Mood and Affect: Mood normal.        Behavior: Behavior normal.        Thought Content: Thought content normal.        Judgment: Judgment normal.     Data: Aortic ultrasound IMPRESSION: 4.8 cm abdominal aortic aneurysm has increased from 2.6 cm on the prior exam. Recommend follow-up every 6 months and vascular consultation. This recommendation follows ACR consensus guidelines: White Paper of the ACR Incidental Findings Committee II on Vascular Findings. J Am Coll Radiol 2013; 10:789-794.  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 The iliac arteries were not assessed on the prior exam.      Assessment/Plan:     80 year old male with 4.8 cm abdominal aortic aneurysm previously measured 3.1 cm and 2014.  He does not have any new back or abdominal pain.  He does have very prominent right popliteal artery.  I discussed with him the risks of rupture and the expected outcomes with rupture.  Currently he is less than 1% risk of rupture of his aortic aneurysm although he is closing on 5 cm at which time we would obtain CT angio.  We will get him to follow-up in 6 months with repeat aortic aneurysm duplex as well as popliteal artery duplexes bilaterally given the prominence of popliteal pulse on the right.  Patient demonstrates very good understanding of our discussions today.     Maeola Harman MD [facility]

## 2020-08-03 ENCOUNTER — Other Ambulatory Visit: Payer: Self-pay

## 2020-08-03 DIAGNOSIS — I714 Abdominal aortic aneurysm, without rupture, unspecified: Secondary | ICD-10-CM

## 2020-08-06 DIAGNOSIS — Z961 Presence of intraocular lens: Secondary | ICD-10-CM | POA: Diagnosis not present

## 2020-08-06 DIAGNOSIS — H26491 Other secondary cataract, right eye: Secondary | ICD-10-CM | POA: Diagnosis not present

## 2020-08-14 DIAGNOSIS — H26492 Other secondary cataract, left eye: Secondary | ICD-10-CM | POA: Diagnosis not present

## 2020-10-11 ENCOUNTER — Other Ambulatory Visit: Payer: Self-pay

## 2020-10-11 DIAGNOSIS — I724 Aneurysm of artery of lower extremity: Secondary | ICD-10-CM

## 2020-11-14 ENCOUNTER — Ambulatory Visit: Payer: Medicare Other | Attending: Internal Medicine

## 2020-11-14 ENCOUNTER — Other Ambulatory Visit (HOSPITAL_BASED_OUTPATIENT_CLINIC_OR_DEPARTMENT_OTHER): Payer: Self-pay

## 2020-11-14 ENCOUNTER — Other Ambulatory Visit: Payer: Self-pay

## 2020-11-14 DIAGNOSIS — Z23 Encounter for immunization: Secondary | ICD-10-CM

## 2020-11-14 MED ORDER — PFIZER-BIONT COVID-19 VAC-TRIS 30 MCG/0.3ML IM SUSP
INTRAMUSCULAR | 0 refills | Status: DC
  Filled 2020-11-14: qty 0.3, 1d supply, fill #0

## 2020-11-14 NOTE — Progress Notes (Signed)
   Covid-19 Vaccination Clinic  Name:  Jacob Bowen    MRN: [id number] DOB: 27-Apr-1941  11/14/2020  [name] was observed post Covid-19 immunization for 15 minutes without incident. He was provided with Vaccine Information Sheet and instruction to access the V-Safe system.   [name] was instructed to call 911 with any severe reactions post vaccine: Difficulty breathing  Swelling of face and throat  A fast heartbeat  A bad rash all over body  Dizziness and weakness   Immunizations Administered     Name Date Dose VIS Date Route   PFIZER Comrnaty(Gray TOP) Covid-19 Vaccine 11/14/2020 11:22 AM 0.3 mL 04/12/2020 Intramuscular   Manufacturer: [employer], [employer]   Lot: [id number]   NDC: [phone]

## 2021-01-09 ENCOUNTER — Other Ambulatory Visit: Payer: Self-pay

## 2021-01-09 ENCOUNTER — Ambulatory Visit: Payer: Medicare Other | Admitting: Physician Assistant

## 2021-01-09 ENCOUNTER — Ambulatory Visit (HOSPITAL_COMMUNITY)
Admission: RE | Admit: 2021-01-09 | Discharge: 2021-01-09 | Disposition: A | Discharge status: Home / Self Care | Payer: Medicare Other | Source: Ambulatory Visit | Attending: Vascular Surgery | Admitting: Vascular Surgery

## 2021-01-09 ENCOUNTER — Ambulatory Visit (INDEPENDENT_AMBULATORY_CARE_PROVIDER_SITE_OTHER)
Admission: RE | Admit: 2021-01-09 | Discharge: 2021-01-09 | Disposition: A | Discharge status: Home / Self Care | Payer: Medicare Other | Source: Ambulatory Visit | Attending: Vascular Surgery | Admitting: Vascular Surgery

## 2021-01-09 VITALS — BP 146/73 | HR 79 | Temp 98.1°F | Resp 20 | Ht 73.0 in | Wt 183.6 lb

## 2021-01-09 DIAGNOSIS — I724 Aneurysm of artery of lower extremity: Secondary | ICD-10-CM

## 2021-01-09 DIAGNOSIS — I714 Abdominal aortic aneurysm, without rupture, unspecified: Secondary | ICD-10-CM

## 2021-01-09 NOTE — Progress Notes (Signed)
Office Note     CC:  follow up Requesting Provider:  Ronnald Nian, MD  HPI: Jacob Bowen is a 80 y.o. ([birth year]) male who presents for 6 month follow-up of AAA and prominent right popliteal pulse. On CT scan dated 07/19/2020, the AAA had increased to 4.8 cm. 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 The iliac arteries were not assessed on the prior exam. He was seen by Dr. Randie Heinz on 07/27/2020.  Today, he has no specific complaints.  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  Occasional left lumbosacral discomfort after physical exercise.  He has occasional left medial knee pain.  Denies claudication or rest pain.  He has no prior cardiac history.  No tobacco use. Not diabetic. History of hypertension.  Past Medical History:  Diagnosis Date   Femoral hernia, bilateral, s/p lap repair 04/13/2012   Hemorrhoids    EXTERNAL   Hypertension     Past Surgical History:  Procedure Laterality Date   COLONOSCOPY     COLONOSCOPY W/ POLYPECTOMY      Social History   Socioeconomic History   Marital status: Divorced    Spouse name: Not on file   Number of children: Not on file   Years of education: Not on file   Highest education level: Not on file  Occupational History   Not on file  Tobacco Use   Smoking status: Never   Smokeless tobacco: Never  Vaping Use   Vaping Use: Never used  Substance and Sexual Activity   Alcohol use: No    Comment: rare   Drug use: Yes    Types: Marijuana   Sexual activity: Not Currently  Other Topics Concern   Not on file  Social History Narrative   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   Social Determinants of Corporate investment banker Strain: Not on file  Food Insecurity: Not on file  Transportation Needs: Not on file  Physical Activity: Not on file  Stress: Not on file  Social Connections:  Not on file  Intimate Partner Violence: Not on file   Family History  Problem Relation Age of Onset   Hypertension Father     Current Outpatient Medications  Medication Sig Dispense Refill   COVID-19 mRNA Vac-TriS, Pfizer, (PFIZER-BIONT COVID-19 VAC-TRIS) SUSP injection Inject into the muscle. 0.3 mL 0   doxazosin (CARDURA) 4 MG tablet Take 1 tablet (4 mg total) by mouth daily. 90 tablet 3   finasteride (PROSCAR) 5 MG tablet Take 1 tablet (5 mg total) by mouth daily. 90 tablet 3   lisinopril-hydrochlorothiazide (ZESTORETIC) 10-12.5 MG tablet Take 1 tablet by mouth daily. 90 tablet 3   No current facility-administered medications for this visit.    No Known Allergies   REVIEW OF SYSTEMS:   [X]  denotes positive finding, [ ]  denotes negative finding Cardiac  Comments:  Chest pain or chest pressure:    Shortness of breath upon exertion:    Short of breath when lying flat:    Irregular heart rhythm:        Vascular    Pain in calf, thigh, or hip brought on by ambulation:    Pain in feet at night that wakes you up from your sleep:     Blood clot in your veins:    Leg swelling:  Pulmonary    Oxygen at home:    Productive cough:     Wheezing:         Neurologic    Sudden weakness in arms or legs:     Sudden numbness in arms or legs:     Sudden onset of difficulty speaking or slurred speech:    Temporary loss of vision in one eye:     Problems with dizziness:         Gastrointestinal    Blood in stool:     Vomited blood:         Genitourinary    Burning when urinating:     Blood in urine:        Psychiatric    Major depression:         Hematologic    Bleeding problems:    Problems with blood clotting too easily:        Skin    Rashes or ulcers:        Constitutional    Fever or chills:      PHYSICAL EXAMINATION:  Vitals:   01/09/21 0848  BP: (!) 146/73  Pulse: 79  Resp: 20  Temp: 98.1 F (36.7 C)  SpO2: 97%     General:  WDWN in NAD;  vital signs documented above Gait: Unaided, no ataxia HENT: WNL, normocephalic Pulmonary: normal non-labored breathing  Cardiac: regular HR, without  Murmurs   Abdomen: soft, NT, prominent aortic pulse Skin: without rashes Vascular Exam/Pulses: 2+ femoral pulses bilaterally.  1+ right popliteal pulse, 2+ right posterior tibial pulse.  I am unable to palpate left popliteal or pedal pulses. Extremities: without ischemic changes, without Gangrene , without cellulitis; without open wounds; both feet are warm and well-perfused without skin changes. Musculoskeletal: no muscle wasting or atrophy  Neurologic: A&O X 3;  No focal weakness or paresthesias are detected Psychiatric:  The pt has Normal affect.   Non-Invasive Vascular Imaging:   01/09/2021 Abdominal Aorta Findings:  +-----------+-------+----------+----------+--------+--------+----------+  Location   AP (cm)Trans (cm)PSV (cm/s)WaveformThrombusComments    +-----------+-------+----------+----------+--------+--------+----------+  Proximal   2.09   1.96      101                                   +-----------+-------+----------+----------+--------+--------+----------+  Mid        4.94   4.75      33                        fusiform    +-----------+-------+----------+----------+--------+--------+----------+  Distal     2.12   2.47      67                                    +-----------+-------+----------+----------+--------+--------+----------+  RT CIA Prox1.6    1.5       85                                    +-----------+-------+----------+----------+--------+--------+----------+  LT CIA Prox1.8    2.2       98                        dissection  +-----------+-------+----------+----------+--------+--------+----------+   Motile structure seen  in left common iliac artery with "flap like"  appearance with color flow surrounding it is consistent with a dissection.   Summary:  Abdominal Aorta:  There is evidence of abnormal dilatation of the mid  Abdominal aorta. There is evidence of abnormal dilation of the Left Common  Iliac artery. The largest aortic measurement is 4.9 cm. 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 The largest aortic diameter remains essentially unchanged  compared to prior exam. Previous diameter measurement was 4.8 cm obtained  on 03//17/2022.     *See table(s) above for measurements and observations.    Preliminary    LE duplex: Summary:  Right: No significant popliteal artery dilatation.   Left: No significant popliteal artery dilatation.  Left common femoral artery appears to ectatic with a maximum diameter of  1.8 cm. The proximal SFA appears to be occluded with the distal vessel  filling via collaterals from the profunda artery.     ASSESSMENT/PLAN:: 80 y.o. male here for follow up for abdominal aortic aneurysm.  His aneurysm now measures approximate 4.9 cm.  He is asymptomatic.  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  I reviewed the study with Dr. Randie Heinz.  The patient has no symptoms referable to to this and is likely chronic in nature.  We will proceed with CTA examination of his aorta and he will follow-up with Dr. Randie Heinz within the next 4 weeks.  The patient is in agreement with this plan.   Milinda Antis, PA-C [facility] [phone]  Clinic MD:   Dr. Randie Heinz

## 2021-02-04 ENCOUNTER — Other Ambulatory Visit: Payer: Self-pay

## 2021-02-04 ENCOUNTER — Ambulatory Visit (HOSPITAL_COMMUNITY)
Admission: RE | Admit: 2021-02-04 | Discharge: 2021-02-04 | Disposition: A | Discharge status: Home / Self Care | Payer: Medicare Other | Source: Ambulatory Visit | Attending: Vascular Surgery | Admitting: Vascular Surgery

## 2021-02-04 ENCOUNTER — Encounter (HOSPITAL_COMMUNITY): Payer: Self-pay

## 2021-02-04 DIAGNOSIS — I714 Abdominal aortic aneurysm, without rupture, unspecified: Secondary | ICD-10-CM | POA: Diagnosis not present

## 2021-02-04 DIAGNOSIS — I7143 Infrarenal abdominal aortic aneurysm, without rupture: Secondary | ICD-10-CM | POA: Diagnosis not present

## 2021-02-04 DIAGNOSIS — K5730 Diverticulosis of large intestine without perforation or abscess without bleeding: Secondary | ICD-10-CM | POA: Diagnosis not present

## 2021-02-04 DIAGNOSIS — I70203 Unspecified atherosclerosis of native arteries of extremities, bilateral legs: Secondary | ICD-10-CM | POA: Diagnosis not present

## 2021-02-04 LAB — POCT I-STAT CREATININE: Creatinine, Ser: 0.8 mg/dL (ref 0.61–1.24)

## 2021-02-04 MED ORDER — IOHEXOL 350 MG/ML SOLN
100.0000 mL | Freq: Once | INTRAVENOUS | Status: AC | PRN
  Administered 2021-02-04: 80 mL via INTRAVENOUS

## 2021-02-06 ENCOUNTER — Ambulatory Visit: Payer: Medicare Other | Admitting: Vascular Surgery

## 2021-02-06 ENCOUNTER — Encounter: Payer: Self-pay | Admitting: Vascular Surgery

## 2021-02-06 ENCOUNTER — Other Ambulatory Visit: Payer: Self-pay

## 2021-02-06 VITALS — BP 113/70 | HR 96 | Temp 98.1°F | Resp 20 | Ht 73.0 in | Wt 193.0 lb

## 2021-02-06 DIAGNOSIS — I7143 Infrarenal abdominal aortic aneurysm, without rupture: Secondary | ICD-10-CM | POA: Diagnosis not present

## 2021-02-06 NOTE — Progress Notes (Signed)
Patient ID: Jacob Bowen, male   DOB: 09-08-1940, 80 y.o.   MRN: [id number]  Reason for Consult: Follow-up   Referred by Ronnald Nian, MD  Subjective:     HPI:  Jacob Bowen is a 80 y.o. male known history of abdominal aortic aneurysm.  He does not have family history of aneurysm and he is a lifelong on tobacco smoker.  At last visit his aneurysm had increased in size.  He has no new back or abdominal pain.  He is very active as a Conservation officer, historic buildings for race cars.  He continues to walk without claudication he denies any rest pain or tissue loss.  No history of stroke, TIA or amaurosis.  Risk factors for vascular disease include hypertension.  Past Medical History:  Diagnosis Date   Femoral hernia, bilateral, s/p lap repair 04/13/2012   Hemorrhoids    EXTERNAL   Hypertension    Family History  Problem Relation Age of Onset   Hypertension Father    Past Surgical History:  Procedure Laterality Date   COLONOSCOPY     COLONOSCOPY W/ POLYPECTOMY      Short Social History:  Social History   Tobacco Use   Smoking status: Never   Smokeless tobacco: Never  Substance Use Topics   Alcohol use: No    Comment: rare    No Known Allergies  Current Outpatient Medications  Medication Sig Dispense Refill   doxazosin (CARDURA) 4 MG tablet Take 1 tablet (4 mg total) by mouth daily. 90 tablet 3   finasteride (PROSCAR) 5 MG tablet Take 1 tablet (5 mg total) by mouth daily. 90 tablet 3   lisinopril-hydrochlorothiazide (ZESTORETIC) 10-12.5 MG tablet Take 1 tablet by mouth daily. 90 tablet 3   No current facility-administered medications for this visit.    Review of Systems  Constitutional:  Constitutional negative. HENT: HENT negative.  Eyes: Eyes negative.  Respiratory: Respiratory negative.  Cardiovascular: Cardiovascular negative.  GI: Gastrointestinal negative.  Musculoskeletal: Musculoskeletal negative.  Skin: Skin negative.  Neurological: Neurological  negative. Hematologic: Hematologic/lymphatic negative.  Psychiatric: Psychiatric negative.       Objective:  Objective   Vitals:   02/06/21 1509  BP: 113/70  Pulse: 96  Resp: 20  Temp: 98.1 F (36.7 C)  SpO2: 97%  Weight: 193 lb (87.5 kg)  Height: 6\' 1"  (1.854 m)   Body mass index is 25.46 kg/m.  Physical Exam HENT:     Head: Normocephalic.     Nose:     Comments: Wearing a mask Eyes:     Pupils: Pupils are equal, round, and reactive to light.  Cardiovascular:     Rate and Rhythm: Normal rate.     Pulses:          Popliteal pulses are 2+ on the right side and 2+ on the left side.  Pulmonary:     Effort: Pulmonary effort is normal.  Abdominal:     General: Abdomen is flat.     Palpations: Abdomen is soft. There is mass.  Musculoskeletal:        General: Normal range of motion.     Cervical back: Normal range of motion.     Right lower leg: No edema.     Left lower leg: No edema.  Skin:    General: Skin is warm.     Capillary Refill: Capillary refill takes less than 2 seconds.  Neurological:     General: No focal deficit present.  Mental Status: He is alert.  Psychiatric:        Mood and Affect: Mood normal.        Behavior: Behavior normal.        Thought Content: Thought content normal.    Data: CTA IMPRESSION: 1. 4.7 cm fusiform infrarenal abdominal Aortic aneurysm NOS (ICD10-I71.9). Current guidelines recommend follow-up CT/MR every 6 months and vascular consultation(the patient is currently under the care of a vascular specialist/surgeon). 2. Dilated 1.6 cm right and 2.2 cm left common iliac arteries. 3. 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 4. Proximal occlusion of the left SFA. 5. Colonic diverticulosis.     Assessment/Plan:     80 year old male with 4.7 cm fusiform infrarenal aneurysm by CT.  We reviewed his CT scan together today.  He does appear to be amenable for endovascular repair should he  ever need.  We discussed the expected course of 1/4 to 1/2 cm growth per year and need for continued ultrasound surveillance.  We would likely consider repair of 5.5 cm and would obtain CT scan when he approaches this.  I discussed the signs and symptoms of rupture and the need to seek urgent medical evaluation.  He demonstrates good understanding we will schedule him a 24-month follow-up.     Maeola Harman MD [facility]

## 2021-02-08 ENCOUNTER — Other Ambulatory Visit: Payer: Self-pay

## 2021-02-08 DIAGNOSIS — I714 Abdominal aortic aneurysm, without rupture, unspecified: Secondary | ICD-10-CM

## 2021-03-20 ENCOUNTER — Ambulatory Visit: Payer: Medicare Other | Attending: Internal Medicine

## 2021-03-20 ENCOUNTER — Other Ambulatory Visit (HOSPITAL_BASED_OUTPATIENT_CLINIC_OR_DEPARTMENT_OTHER): Payer: Self-pay

## 2021-03-20 DIAGNOSIS — Z23 Encounter for immunization: Secondary | ICD-10-CM

## 2021-03-20 MED ORDER — PFIZER COVID-19 VAC BIVALENT 30 MCG/0.3ML IM SUSP
INTRAMUSCULAR | 0 refills | Status: DC
  Filled 2021-03-20: qty 0.3, 1d supply, fill #0

## 2021-03-20 NOTE — Progress Notes (Signed)
   Covid-19 Vaccination Clinic  Name:  Jacob Bowen    MRN: [id number] DOB: 01-22-1941  03/20/2021  Mr. Jacob Bowen was observed post Covid-19 immunization for 15 minutes without incident. He was provided with Vaccine Information Sheet and instruction to access the V-Safe system.   Mr. Jacob Bowen was instructed to call 911 with any severe reactions post vaccine: Difficulty breathing  Swelling of face and throat  A fast heartbeat  A bad rash all over body  Dizziness and weakness   Immunizations Administered     Name Date Dose VIS Date Route   Pfizer Covid-19 Vaccine Bivalent Booster 03/20/2021  1:38 PM 0.3 mL 01/02/2021 Intramuscular   Manufacturer: [employer], [employer]   Lot: [id number]   NDC: [phone]

## 2021-06-17 ENCOUNTER — Other Ambulatory Visit (HOSPITAL_BASED_OUTPATIENT_CLINIC_OR_DEPARTMENT_OTHER): Payer: Self-pay

## 2021-06-17 MED ORDER — INFLUENZA VAC A&B SA ADJ QUAD 0.5 ML IM PRSY
PREFILLED_SYRINGE | INTRAMUSCULAR | 0 refills | Status: DC
  Filled 2021-06-17: qty 0.5, 1d supply, fill #0

## 2021-07-06 ENCOUNTER — Other Ambulatory Visit: Payer: Self-pay | Admitting: Family Medicine

## 2021-07-06 DIAGNOSIS — R3914 Feeling of incomplete bladder emptying: Secondary | ICD-10-CM

## 2021-07-06 DIAGNOSIS — I10 Essential (primary) hypertension: Secondary | ICD-10-CM

## 2021-07-06 DIAGNOSIS — N401 Enlarged prostate with lower urinary tract symptoms: Secondary | ICD-10-CM

## 2021-07-08 ENCOUNTER — Other Ambulatory Visit (HOSPITAL_BASED_OUTPATIENT_CLINIC_OR_DEPARTMENT_OTHER): Payer: Self-pay

## 2021-07-08 MED ORDER — BOOSTRIX 5-2.5-18.5 LF-MCG/0.5 IM SUSY
PREFILLED_SYRINGE | INTRAMUSCULAR | 0 refills | Status: DC
  Filled 2021-07-08: qty 0.5, 1d supply, fill #0

## 2021-07-09 ENCOUNTER — Ambulatory Visit: Payer: Medicare Other | Admitting: Family Medicine

## 2021-07-16 ENCOUNTER — Ambulatory Visit (INDEPENDENT_AMBULATORY_CARE_PROVIDER_SITE_OTHER): Payer: Medicare Other | Admitting: Family Medicine

## 2021-07-16 ENCOUNTER — Encounter: Payer: Self-pay | Admitting: Family Medicine

## 2021-07-16 VITALS — BP 120/76 | HR 70 | Temp 97.7°F | Ht 72.0 in | Wt 188.8 lb

## 2021-07-16 DIAGNOSIS — R7301 Impaired fasting glucose: Secondary | ICD-10-CM | POA: Diagnosis not present

## 2021-07-16 DIAGNOSIS — E785 Hyperlipidemia, unspecified: Secondary | ICD-10-CM

## 2021-07-16 DIAGNOSIS — H9113 Presbycusis, bilateral: Secondary | ICD-10-CM | POA: Diagnosis not present

## 2021-07-16 DIAGNOSIS — N401 Enlarged prostate with lower urinary tract symptoms: Secondary | ICD-10-CM | POA: Diagnosis not present

## 2021-07-16 DIAGNOSIS — Z13220 Encounter for screening for lipoid disorders: Secondary | ICD-10-CM

## 2021-07-16 DIAGNOSIS — R3914 Feeling of incomplete bladder emptying: Secondary | ICD-10-CM | POA: Diagnosis not present

## 2021-07-16 DIAGNOSIS — I714 Abdominal aortic aneurysm, without rupture, unspecified: Secondary | ICD-10-CM

## 2021-07-16 DIAGNOSIS — I10 Essential (primary) hypertension: Secondary | ICD-10-CM | POA: Diagnosis not present

## 2021-07-16 DIAGNOSIS — M25552 Pain in left hip: Secondary | ICD-10-CM | POA: Diagnosis not present

## 2021-07-16 DIAGNOSIS — Z0000 Encounter for general adult medical examination without abnormal findings: Secondary | ICD-10-CM

## 2021-07-16 DIAGNOSIS — Z136 Encounter for screening for cardiovascular disorders: Secondary | ICD-10-CM | POA: Diagnosis not present

## 2021-07-16 MED ORDER — FINASTERIDE 5 MG PO TABS: 5.0000 mg | ORAL_TABLET | Freq: Every day | ORAL | 3 refills | Status: DC

## 2021-07-16 MED ORDER — LISINOPRIL-HYDROCHLOROTHIAZIDE 10-12.5 MG PO TABS: 1.0000 | ORAL_TABLET | Freq: Every day | ORAL | 3 refills | Status: DC

## 2021-07-16 MED ORDER — DOXAZOSIN MESYLATE 4 MG PO TABS: 4.0000 mg | ORAL_TABLET | Freq: Every day | ORAL | 3 refills | Status: DC

## 2021-07-16 NOTE — Progress Notes (Signed)
Jacob Bowen is a 81 y.o. male who presents for annual wellness visit and follow-up on chronic medical conditions.  He has no particular concerns or complaints.  He does have evidence of AAA and is scheduled to follow-up with cardiovascular surgery in the near future.  He also complains of some left hip discomfort.  It does tend to bother him more when he is more physically active.  He is not taking any medication for this.  He continues on Cardura as well as lisinopril/HCTZ for his blood pressure and is doing well on that.  Proscar is helping with his BPH symptoms.  He does wear hearing aids.  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  He does have hyperlipidemia but presently is on no medication. ? ? ?Immunizations and Health Maintenance ?Immunization History  ?Administered Date(s) Administered  ? DTaP 06/02/1994  ? Fluad Quad(high Dose 65+) 06/17/2021  ? Influenza Split 04/11/2011  ? Influenza Whole 05/17/2008, 05/18/2009  ? Influenza, High Dose Seasonal PF 05/08/2014, 05/21/2015, 06/06/2016, 05/24/2018  ? Influenza-Unspecified 06/20/2017, 05/24/2018  ? PFIZER Comirnaty(Gray Top)Covid-19 Tri-Sucrose Vaccine 11/14/2020  ? PFIZER(Purple Top)SARS-COV-2 Vaccination 06/30/2019, 07/26/2019, 03/03/2020  ? Research officer, trade union 23yrs & up 03/20/2021  ? Pneumococcal Conjugate-13 05/08/2014  ? Pneumococcal Polysaccharide-23 05/20/2006  ? Tdap 07/08/2021  ? Zoster Recombinat (Shingrix) 12/28/2019, 03/16/2020  ? Zoster, Live 05/20/2006  ? ?There are no preventive care reminders to display for this patient. ? ?Last colonoscopy: 03/22/2007 [facility] ?Last PSA: 06/06/16 ( 6.2) ?Dentist: Q six month ?Ophtho: Q year ?Exercise: works on Garment/textile technologist house  ? ?Other doctors caring for patient include: Dr. Dione Bowen EYE ?           Dr. Randie Bowen vasc. ?           Dr. Ezzard Bowen ENT ? ?Advanced Directives: ?Does Patient Have a Medical Advance Directive?: No ?Would patient  like information on creating a medical advance directive?: Yes (ED - Information included in AVS) ? ?Depression screen:  See questionnaire below.   ?  ?Depression screen [facility] 2/9 07/16/2021 07/03/2020 06/28/2019 06/24/2018 06/22/2017  ?Decreased Interest 0 0 0 0 0  ?Down, Depressed, Hopeless 0 0 0 0 -  ?PHQ - 2 Score 0 0 0 0 0  ? ? ?Fall Screen: See Questionaire below. ?  ?Fall Risk  07/16/2021 07/03/2020 06/28/2019 06/24/2018 06/22/2017  ?Falls in the past year? 0 0 0 0 No  ?Comment - - - - -  ?Number falls in past yr: 0 0 - - -  ?Injury with Fall? 0 0 - - -  ?Risk for fall due to : No Fall Risks No Fall Risks - - -  ?Follow up Falls evaluation completed Falls evaluation completed - - -  ? ? ?ADL screen:  See questionnaire below.  ?Functional Status Survey: ?Is the patient deaf or have difficulty hearing?: Yes (hearing aides) ?Does the patient have difficulty seeing, even when wearing glasses/contacts?: No ?Does the patient have difficulty concentrating, remembering, or making decisions?: No ?Does the patient have difficulty walking or climbing stairs?: No ?Does the patient have difficulty dressing or bathing?: No ?Does the patient have difficulty doing errands alone such as visiting a doctor's office or shopping?: No ? ? ?Review of Systems ? ?Constitutional: -, -unexpected weight change, -anorexia, -fatigue ?Allergy: -sneezing, -itching, -congestion ?Dermatology: denies changing moles, rash, lumps ?ENT: -runny nose, -ear pain, -sore throat,  ?Cardiology:  -chest  pain, -palpitations, -orthopnea, ?Respiratory: -cough, -shortness of breath, -dyspnea on exertion, -wheezing,  ?Gastroenterology: -abdominal pain, -nausea, -vomiting, -diarrhea, -constipation, -dysphagia ?Hematology: -bleeding or bruising problems ?Musculoskeletal: -arthralgias, -myalgias, -joint swelling, -back pain, - ?Ophthalmology: -vision changes,  ?Urology: -dysuria, -difficulty urinating,  -urinary frequency, -urgency, incontinence ?Neurology: -, -numbness, ,  -memory loss, -falls, -dizziness ? ? ? ?PHYSICAL EXAM: ? ?General Appearance: Alert, cooperative, no distress, appears stated age ?Head: Normocephalic, without obvious abnormality, atraumatic ?Eyes: PERRL, conjunctiva/corneas clear, EOM's intact,  ?Ears: Normal TM's and external ear canals ?Nose: Nares normal, mucosa normal, no drainage or sinus   tenderness ?Throat: Lips, mucosa, and tongue normal; teeth and gums normal ?Neck: Supple, no lymphadenopathy, thyroid:no enlargement/tenderness/nodules; no carotid bruit or JVD ?Lungs: Clear to auscultation bilaterally without wheezes, rales or ronchi; respirations unlabored ?Heart: Regular rate and rhythm, S1 and S2 normal, no murmur, rub or gallop ?Abdomen: Soft, non-tender, nondistended, normoactive bowel sounds, no masses, no hepatosplenomegaly ?Skin: Skin color, texture, turgor normal, no rashes or lesions ?Lymph nodes: Cervical, supraclavicular, and axillary nodes normal ?Neurologic: CNII-XII intact, normal strength, sensation and gait; reflexes 2+ and symmetric throughout   ?Psych: Normal mood, affect, hygiene and grooming ?Exam of the left hip does show limited range of motion with internal rotation. ?ASSESSMENT/PLAN: ?Routine general medical examination at a health care facility - Plan: CBC with Differential/Platelet, Comprehensive metabolic panel, Lipid panel ? ?Abdominal aortic aneurysm (AAA) without rupture, unspecified part ? ?Essential hypertension - Plan: lisinopril-hydrochlorothiazide (ZESTORETIC) 10-12.5 MG tablet ? ?Hyperlipidemia LDL goal <130 ? ?Benign prostatic hyperplasia without lower urinary tract symptoms ? ?Benign prostatic hyperplasia with incomplete bladder emptying - Plan: doxazosin (CARDURA) 4 MG tablet, finasteride (PROSCAR) 5 MG tablet ? ?Left hip pain - Plan: DG Hip Unilat W OR W/O Pelvis 2-3 Views Left ? ?Screening for lipid disorders - Plan: Lipid panel ?I discussed the hip pain with him explaining that since he is not having a great deal  of difficulty, further intervention will be based on what the x-ray shows.  Explained that if this is arthritis it does not necessarily mean that he needs to be considered for surgery. ? ? ?. Immunization recommendations discussed.  Colonoscopy recommendations reviewed. ? ? ?Medicare Attestation ?I have personally reviewed: ?The patient's medical and social history ?Their use of alcohol, tobacco or illicit drugs ?Their current medications and supplements ?The patient's functional ability including ADLs,fall risks, home safety risks, cognitive, and hearing and visual impairment ?Diet and physical activities ?Evidence for depression or mood disorders ? ?The patient's weight, height, and BMI have been recorded in the chart.  I have made referrals, counseling, and provided education to the patient based on review of the above and I have provided the patient with a written personalized care plan for preventive services.   ? ? ?Sharlot Gowda, MD   07/16/2021  ? ? ? ?

## 2021-07-17 LAB — CBC WITH DIFFERENTIAL/PLATELET
Basophils Absolute: 0 10*3/uL (ref 0.0–0.2)
Basos: 0 %
EOS (ABSOLUTE): 0.1 10*3/uL (ref 0.0–0.4)
Eos: 1 %
Hematocrit: 40.5 % (ref 37.5–51.0)
Hemoglobin: 14.2 g/dL (ref 13.0–17.7)
Immature Grans (Abs): 0 10*3/uL (ref 0.0–0.1)
Immature Granulocytes: 0 %
Lymphocytes Absolute: 1.4 10*3/uL (ref 0.7–3.1)
Lymphs: 21 %
MCH: 31.3 pg (ref 26.6–33.0)
MCHC: 35.1 g/dL (ref 31.5–35.7)
MCV: 89 fL (ref 79–97)
Monocytes Absolute: 0.6 10*3/uL (ref 0.1–0.9)
Monocytes: 9 %
Neutrophils Absolute: 4.6 10*3/uL (ref 1.4–7.0)
Neutrophils: 69 %
Platelets: 236 10*3/uL (ref 150–450)
RBC: 4.53 x10E6/uL (ref 4.14–5.80)
RDW: 12.1 % (ref 11.6–15.4)
WBC: 6.7 10*3/uL (ref 3.4–10.8)

## 2021-07-17 LAB — COMPREHENSIVE METABOLIC PANEL
ALT: 9 IU/L (ref 0–44)
AST: 19 IU/L (ref 0–40)
Albumin/Globulin Ratio: 1.5 (ref 1.2–2.2)
Albumin: 4.3 g/dL (ref 3.7–4.7)
Alkaline Phosphatase: 86 IU/L (ref 44–121)
BUN/Creatinine Ratio: 12 (ref 10–24)
BUN: 10 mg/dL (ref 8–27)
Bilirubin Total: 0.3 mg/dL (ref 0.0–1.2)
CO2: 25 mmol/L (ref 20–29)
Calcium: 9.5 mg/dL (ref 8.6–10.2)
Chloride: 101 mmol/L (ref 96–106)
Creatinine, Ser: 0.83 mg/dL (ref 0.76–1.27)
Globulin, Total: 2.9 g/dL (ref 1.5–4.5)
Glucose: 119 mg/dL — ABNORMAL HIGH (ref 70–99)
Potassium: 4.2 mmol/L (ref 3.5–5.2)
Sodium: 140 mmol/L (ref 134–144)
Total Protein: 7.2 g/dL (ref 6.0–8.5)
eGFR: 90 mL/min/{1.73_m2} (ref >59)

## 2021-07-17 LAB — LIPID PANEL
Chol/HDL Ratio: 3.5 ratio (ref 0.0–5.0)
Cholesterol, Total: 180 mg/dL (ref 100–199)
HDL: 52 mg/dL (ref >39)
LDL Chol Calc (NIH): 112 mg/dL — ABNORMAL HIGH (ref 0–99)
Triglycerides: 85 mg/dL (ref 0–149)
VLDL Cholesterol Cal: 16 mg/dL (ref 5–40)

## 2021-07-18 LAB — HGB A1C W/O EAG: Hgb A1c MFr Bld: 5.7 % — ABNORMAL HIGH (ref 4.8–5.6)

## 2021-07-18 LAB — SPECIMEN STATUS REPORT

## 2021-08-08 ENCOUNTER — Ambulatory Visit (HOSPITAL_COMMUNITY)
Admission: RE | Admit: 2021-08-08 | Discharge: 2021-08-08 | Disposition: A | Discharge status: Home / Self Care | Payer: Medicare Other | Source: Ambulatory Visit | Attending: Vascular Surgery | Admitting: Vascular Surgery

## 2021-08-08 ENCOUNTER — Ambulatory Visit (INDEPENDENT_AMBULATORY_CARE_PROVIDER_SITE_OTHER): Payer: Medicare Other | Admitting: Physician Assistant

## 2021-08-08 VITALS — BP 141/72 | HR 80 | Temp 98.4°F | Resp 20 | Ht 72.0 in | Wt 192.8 lb

## 2021-08-08 DIAGNOSIS — I7409 Other arterial embolism and thrombosis of abdominal aorta: Secondary | ICD-10-CM | POA: Diagnosis not present

## 2021-08-08 DIAGNOSIS — I714 Abdominal aortic aneurysm, without rupture, unspecified: Secondary | ICD-10-CM

## 2021-08-08 NOTE — Progress Notes (Signed)
?Office Note  ? ? ? ?CC:  follow up ?Requesting Provider:  Ronnald Nian, MD ? ?HPI: Jacob Bowen is a 81 y.o. ([birth year]) male who presents for surveillance of AAA.  He denies new or changing abdominal or back pain.  September 2022, duplex measured AAA to be 4.7 cm. ? ?He is also followed for left iliac artery stenosis.  He has claudication symptoms of left buttock and posterior thigh after mowing the lawn for 10 minutes.  Symptoms resolved with rest and he is able to repeat another 10 minutes of mowing.  He denies any rest pain or tissue changes of left foot.  He denies tobacco use and has never smoked in his lifetime.  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 ? ?Past Medical History:  ?Diagnosis Date  ? Femoral hernia, bilateral, s/p lap repair 04/13/2012  ? Hemorrhoids   ? EXTERNAL  ? Hypertension   ? ? ?Past Surgical History:  ?Procedure Laterality Date  ? COLONOSCOPY    ? COLONOSCOPY W/ POLYPECTOMY    ? ? ?Social History  ? ?Socioeconomic History  ? Marital status: Divorced  ?  Spouse name: Not on file  ? Number of children: Not on file  ? Years of education: Not on file  ? Highest education level: Not on file  ?Occupational History  ? Not on file  ?Tobacco Use  ? Smoking status: Never  ?  Passive exposure: Never  ? Smokeless tobacco: Never  ?Vaping Use  ? Vaping Use: Never used  ?Substance and Sexual Activity  ? Alcohol use: No  ?  Comment: rare  ? Drug use: Yes  ?  Types: Marijuana  ? Sexual activity: Not Currently  ?Other Topics Concern  ? Not on file  ?Social History Narrative  ? 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 ?Social Determinants of Health  ? ?Financial Resource Strain: Not on file  ?Food Insecurity: Not on file  ?Transportation Needs: Not on file  ?Physical Activity: Not on file  ?Stress: Not on file  ?Social Connections: Not on file  ?Intimate Partner Violence: Not on file  ? ? ?Family History  ?Problem Relation  Age of Onset  ? Hypertension Father   ? ? ?Current Outpatient Medications  ?Medication Sig Dispense Refill  ? doxazosin (CARDURA) 4 MG tablet Take 1 tablet (4 mg total) by mouth daily. 90 tablet 3  ? finasteride (PROSCAR) 5 MG tablet Take 1 tablet (5 mg total) by mouth daily. 90 tablet 3  ? lisinopril-hydrochlorothiazide (ZESTORETIC) 10-12.5 MG tablet Take 1 tablet by mouth daily. 90 tablet 3  ? ?No current facility-administered medications for this visit.  ? ? ?No Known Allergies ? ? ?REVIEW OF SYSTEMS:  ? ?[X]  denotes positive finding, [ ]  denotes negative finding ?Cardiac  Comments:  ?Chest pain or chest pressure:    ?Shortness of breath upon exertion:    ?Short of breath when lying flat:    ?Irregular heart rhythm:    ?    ?Vascular    ?Pain in calf, thigh, or hip brought on by ambulation:    ?Pain in feet at night that wakes you up from your sleep:     ?Blood clot in your veins:    ?Leg swelling:     ?    ?Pulmonary    ?Oxygen at home:    ?Productive cough:     ?Wheezing:     ?    ?  Neurologic    ?Sudden weakness in arms or legs:     ?Sudden numbness in arms or legs:     ?Sudden onset of difficulty speaking or slurred speech:    ?Temporary loss of vision in one eye:     ?Problems with dizziness:     ?    ?Gastrointestinal    ?Blood in stool:     ?Vomited blood:     ?    ?Genitourinary    ?Burning when urinating:     ?Blood in urine:    ?    ?Psychiatric    ?Major depression:     ?    ?Hematologic    ?Bleeding problems:    ?Problems with blood clotting too easily:    ?    ?Skin    ?Rashes or ulcers:    ?    ?Constitutional    ?Fever or chills:    ? ? ?PHYSICAL EXAMINATION: ? ?Vitals:  ? 08/08/21 0939  ?BP: (!) 141/72  ?Pulse: 80  ?Resp: 20  ?Temp: 98.4 ?F (36.9 ?C)  ?TempSrc: Temporal  ?SpO2: 95%  ?Weight: 192 lb 12.8 oz (87.5 kg)  ?Height: 6' (1.829 m)  ? ? ?General:  WDWN in NAD; vital signs documented above ?Gait: Not observed ?HENT: WNL, normocephalic ?Pulmonary: normal non-labored breathing , without Rales,  rhonchi,  wheezing ?Cardiac: regular HR ?Abdomen: soft, NT, no masses ?Skin: without rashes ?Vascular Exam/Pulses: ? Right Left  ?Radial 2+ (normal) 2+ (normal)  ?Femoral 2+ (normal) 1+ (weak)  ?PT 2+ (normal) absent  ? ?Extremities: without ischemic changes, without Gangrene , without cellulitis; without open wounds;  ?Musculoskeletal: no muscle wasting or atrophy  ?Neurologic: A&O X 3;  No focal weakness or paresthesias are detected ?Psychiatric:  The pt has Normal affect. ? ? ?Non-Invasive Vascular Imaging:   ?4.87 mid AAA ?Right CIA 1.9 cm ?Left EIA 2.3 cm ? ?Low velocity in the left common iliac artery ?571 cm/s and left proximal external iliac artery ? ? ? ?ASSESSMENT/PLAN:: 81 y.o. 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 ?-Patient experiences left buttock and posterior thigh claudication however this does not impact his quality of life.  He is without rest pain or tissue loss.  No indication for revascularization of left external iliac artery currently ?-AAA measures 4.87 in the mid abdominal aorta.  No indication for repair at this time.  We will repeat aortoiliac duplex in 6 months per protocol ?-Patient knows to call/return office sooner if he develops worsening claudication or rest pain or tissue loss of left lower extremity ? ? ?Emilie Rutter, PA-C ?[facility] ?[phone] ? ?Clinic MD:   Randie Heinz ? ?

## 2021-08-19 ENCOUNTER — Other Ambulatory Visit: Payer: Self-pay | Admitting: *Deleted

## 2021-08-19 DIAGNOSIS — I7409 Other arterial embolism and thrombosis of abdominal aorta: Secondary | ICD-10-CM

## 2021-08-26 DIAGNOSIS — Z961 Presence of intraocular lens: Secondary | ICD-10-CM | POA: Diagnosis not present

## 2021-08-26 DIAGNOSIS — H26492 Other secondary cataract, left eye: Secondary | ICD-10-CM | POA: Diagnosis not present

## 2022-01-08 ENCOUNTER — Encounter: Payer: Self-pay | Admitting: Internal Medicine

## 2022-02-11 ENCOUNTER — Encounter: Payer: Self-pay | Admitting: Internal Medicine

## 2022-02-13 ENCOUNTER — Other Ambulatory Visit (HOSPITAL_BASED_OUTPATIENT_CLINIC_OR_DEPARTMENT_OTHER): Payer: Self-pay

## 2022-02-13 MED ORDER — COVID-19 MRNA 2023-2024 VACCINE (COMIRNATY) 0.3 ML INJECTION
INTRAMUSCULAR | 0 refills | Status: DC
  Filled 2022-02-13: qty 0.3, 1d supply, fill #0

## 2022-02-13 MED ORDER — INFLUENZA VAC A&B SA ADJ QUAD 0.5 ML IM PRSY
PREFILLED_SYRINGE | INTRAMUSCULAR | 0 refills | Status: DC
  Filled 2022-02-13: qty 0.5, 1d supply, fill #0

## 2022-02-24 ENCOUNTER — Encounter: Payer: Self-pay | Admitting: Internal Medicine

## 2022-02-24 ENCOUNTER — Encounter: Payer: Self-pay | Admitting: Family Medicine

## 2022-02-24 ENCOUNTER — Ambulatory Visit (INDEPENDENT_AMBULATORY_CARE_PROVIDER_SITE_OTHER): Payer: Medicare Other | Admitting: Family Medicine

## 2022-02-24 VITALS — BP 94/60 | HR 78 | Temp 98.3°F | Wt 196.6 lb

## 2022-02-24 DIAGNOSIS — I7409 Other arterial embolism and thrombosis of abdominal aorta: Secondary | ICD-10-CM | POA: Insufficient documentation

## 2022-02-24 DIAGNOSIS — R42 Dizziness and giddiness: Secondary | ICD-10-CM | POA: Diagnosis not present

## 2022-02-24 DIAGNOSIS — I714 Abdominal aortic aneurysm, without rupture, unspecified: Secondary | ICD-10-CM | POA: Diagnosis not present

## 2022-02-24 NOTE — Progress Notes (Signed)
   Subjective:    Patient ID: Jacob Bowen, male    DOB: November 08, 1940, 81 y.o.   MRN: [id number]  HPI He came to the office today because of an episode of dizziness.  He was apparently out shopping and bent over slightly to look at something on the lower shelf and when he stood up became dizzy however the dizziness did not go away for well over 10 minutes.  No blurred vision, double vision, heart rate changes weakness, numbness or tingling..  Review of the record indicates evidence of AAA is well as aortoiliac occlusive disease.  He is being followed by [facility] for this.   Review of Systems     Objective:   Physical Exam Alert and in no distress.  EOMI.  Carotid exam does show bilateral 1/6 bruits noted.  Cardiac exam shows regular rhythm without murmurs or gallops.  Normal strength and DTRs in his arms.       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

## 2022-02-25 ENCOUNTER — Ambulatory Visit
Admission: RE | Admit: 2022-02-25 | Discharge: 2022-02-25 | Disposition: A | Discharge status: Home / Self Care | Payer: Medicare Other | Source: Ambulatory Visit | Attending: Family Medicine | Admitting: Family Medicine

## 2022-02-25 DIAGNOSIS — I6523 Occlusion and stenosis of bilateral carotid arteries: Secondary | ICD-10-CM | POA: Diagnosis not present

## 2022-02-25 DIAGNOSIS — R42 Dizziness and giddiness: Secondary | ICD-10-CM | POA: Diagnosis not present

## 2022-02-25 DIAGNOSIS — R0989 Other specified symptoms and signs involving the circulatory and respiratory systems: Secondary | ICD-10-CM | POA: Diagnosis not present

## 2022-02-28 ENCOUNTER — Telehealth: Payer: Self-pay | Admitting: Family Medicine

## 2022-02-28 NOTE — Telephone Encounter (Signed)
Pt called and said he had a vm from you to call but he wasn't sure what for? He is asking for you to call back

## 2022-03-05 ENCOUNTER — Encounter: Payer: Self-pay | Admitting: Family Medicine

## 2022-03-05 ENCOUNTER — Ambulatory Visit (INDEPENDENT_AMBULATORY_CARE_PROVIDER_SITE_OTHER): Payer: Medicare Other | Admitting: Family Medicine

## 2022-03-05 VITALS — BP 122/68 | HR 68 | Ht 72.0 in | Wt 195.6 lb

## 2022-03-05 DIAGNOSIS — R42 Dizziness and giddiness: Secondary | ICD-10-CM | POA: Diagnosis not present

## 2022-03-05 NOTE — Progress Notes (Signed)
   Subjective:    Patient ID: Jacob Bowen, male    DOB: September 02, 1940, 81 y.o.   MRN: [id number]  HPI He is here for a recheck.  He states that he had that 1 episode of dizziness that lasted 10 minutes and he has not had 1 since then.  He has had some visual issues in terms of depth perception and he plans to discuss this further with his ophthalmologist.   Review of Systems     Objective:   Physical Exam Alert and in no distress otherwise not examined       Assessment & Plan:  Dizzy spells I explained that since he has not had any more episodes, no further intervention is necessary.  If this occurs again he will let me know and we can reevaluate.  He was comfortable with that.

## 2022-04-22 ENCOUNTER — Other Ambulatory Visit: Payer: Self-pay | Admitting: Family Medicine

## 2022-04-22 DIAGNOSIS — N401 Enlarged prostate with lower urinary tract symptoms: Secondary | ICD-10-CM

## 2022-05-06 NOTE — Progress Notes (Unsigned)
HISTORY AND PHYSICAL     CC:  follow up. Requesting Provider:  Ronnald Nian, MD  HPI: This is a 82 y.o. male who is here today for follow up for AAA and also followed for left iliac artery stenosis.    Pt was last seen 08/08/2021 and at that time, he did not have any new back or abdominal pain.  He was having claudication of the left buttock and posterior thigh after mowing the lawn for 10 minutes.  His sx resolved with rest and able to continue with another 10 minutes.  He was not having any rest pain or tissue loss.  His AAA measured 4.87 in the mid abdominal aorta.  His quality of life was not impacted by his claudication and there was no indication for intervention and he was scheduled for 6 month follow up.   Pt is a Veterinary surgeon.  The pt returns today for follow up.  He states that he continues to have some back pain but this is not new or sudden or sharp in nature.  He denies any abdominal pain.  He does have complaints of his left leg cramping after about 10 minutes of exertion like mowing the yard or walking up hill.  He tells me this interferes with his work as well.  He has to sit for about 30 minutes after the cramping to recover.  He does not take a daily asa or statin.    The pt is not on a statin for cholesterol management.    The pt is not on an aspirin.    Other AC:  none The pt is on ACEI/diuretic for hypertension.  The pt not have diabetes. Tobacco hx:  never  Pt does not have family hx of AAA.  Past Medical History:  Diagnosis Date   Femoral hernia, bilateral, s/p lap repair 04/13/2012   Hemorrhoids    EXTERNAL   Hypertension     Past Surgical History:  Procedure Laterality Date   COLONOSCOPY     COLONOSCOPY W/ POLYPECTOMY      No Known Allergies  Current Outpatient Medications  Medication Sig Dispense Refill   doxazosin (CARDURA) 4 MG tablet Take 1 tablet (4 mg total) by mouth daily. 90 tablet 3   finasteride (PROSCAR) 5 MG tablet TAKE 1  TABLET (5 MG TOTAL) BY MOUTH DAILY. 90 tablet 0   lisinopril-hydrochlorothiazide (ZESTORETIC) 10-12.5 MG tablet Take 1 tablet by mouth daily. 90 tablet 3   No current facility-administered medications for this visit.    Family History  Problem Relation Age of Onset   Hypertension Father     Social History   Socioeconomic History   Marital status: Divorced    Spouse name: Not on file   Number of children: Not on file   Years of education: Not on file   Highest education level: Not on file  Occupational History   Not on file  Tobacco Use   Smoking status: Never    Passive exposure: Never   Smokeless tobacco: Never  Vaping Use   Vaping Use: Never used  Substance and Sexual Activity   Alcohol use: No    Comment: rare   Drug use: Yes    Types: Marijuana   Sexual activity: Not Currently  Other Topics Concern   Not on file  Social History Narrative   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   Social Determinants of  Health   Financial Resource Strain: Not on file  Food Insecurity: Not on file  Transportation Needs: Not on file  Physical Activity: Not on file  Stress: Not on file  Social Connections: Not on file  Intimate Partner Violence: Not on file     REVIEW OF SYSTEMS:   [X]  denotes positive finding, [ ]  denotes negative finding Cardiac  Comments:  Chest pain or chest pressure:    Shortness of breath upon exertion:    Short of breath when lying flat:    Irregular heart rhythm:        Vascular    Pain in calf, thigh, or hip brought on by ambulation: x   Pain in feet at night that wakes you up from your sleep:     Blood clot in your veins:    Leg swelling:  x Ankle/feet      Pulmonary    Oxygen at home:    Productive cough:     Wheezing:         Neurologic    Sudden weakness in arms or legs:     Sudden numbness in arms or legs:     Sudden onset of difficulty speaking or slurred speech:     Temporary loss of vision in one eye:     Problems with dizziness:         Gastrointestinal    Blood in stool:     Vomited blood:         Genitourinary    Burning when urinating:     Blood in urine:        Psychiatric    Major depression:         Hematologic    Bleeding problems:    Problems with blood clotting too easily:        Skin    Rashes or ulcers:        Constitutional    Fever or chills:      PHYSICAL EXAMINATION:  Today's Vitals   05/07/22 0859  BP: 116/67  Pulse: 79  Temp: 98.1 F (36.7 C)  TempSrc: Temporal  SpO2: 97%  Weight: 199 lb (90.3 kg)  Height: 6\' 1"  (1.854 m)   Body mass index is 26.25 kg/m.   General:  WDWN in NAD; vital signs documented above Gait: Not observed HENT: WNL, normocephalic Pulmonary: normal non-labored breathing  Cardiac: regular HR, without carotid bruits Abdomen: soft, NT; aortic pulse is palpable Skin: without rashes Vascular Exam/Pulses:  Right Left  Radial 2+ (normal) 2+ (normal)  Femoral Faintly palpable Unable to palpate  Popliteal Unable to palpate 2+ (normal)  DP monophasic absent  PT monophasic monophasic   Extremities: without ischemic changes, without Gangrene , without cellulitis; without open wounds; mild pitting edema at ankles.   Musculoskeletal: no muscle wasting or atrophy  Neurologic: A&O X 3;  No focal weakness or paresthesias are detected Psychiatric:  The pt has Normal affect.   Non-Invasive Vascular Imaging:   AAA Arterial duplex on 05/07/2022: Abdominal Aorta Findings:  +-------------+-------+----------+----------+----------+--------+--------+  Location    AP (cm)Trans (cm)PSV (cm/s)Waveform  ThrombusComments  +-------------+-------+----------+----------+----------+--------+--------+  Proximal    2.81             89        triphasic                   +-------------+-------+----------+----------+----------+--------+--------+  Mid         1.87   1.80  130        triphasic                   +-------------+-------+----------+----------+----------+--------+--------+  Distal      4.82   4.87      30        triphasic         fusiform  +-------------+-------+----------+----------+----------+--------+--------+  RT CIA Prox  2.0    2.2       108       triphasic                   +-------------+-------+----------+----------+----------+--------+--------+  LT CIA Prox  2.2    2.0       176       monophasic                  +-------------+-------+----------+----------+----------+--------+--------+  LT CIA Mid                    252       monophasic                  +-------------+-------+----------+----------+----------+--------+--------+  LT CIA Distal                 315       monophasic                  +-------------+-------+----------+----------+----------+--------+--------+  LT EIA Prox                   370       monophasic                  +-------------+-------+----------+----------+----------+--------+--------+  LT EIA Mid                    674       monophasic                  +-------------+-------+----------+----------+----------+--------+--------+  LT EIA Distal                 196       monophasic                  +-------------+-------+----------+----------+----------+--------+--------+   Visualization of the Proximal Abdominal Aorta was limited.   Summary:  Abdominal Aorta: There is evidence of abnormal dilatation of the distal Abdominal aorta. The largest aortic measurement is 4.9 cm. The largest aortic diameter remains essentially unchanged compared to prior exam. Previous diameter measurement was 4.9 cm obtained on 08/08/21.   Stenosis: +-------------------+-------------+  Location           Stenosis       +-------------------+-------------+  Mid Aorta          >50% stenosis  +-------------------+-------------+  Left Common Iliac  >50% stenosis   +-------------------+-------------+  Left External Iliac>50% stenosis  +-------------------+-------------+   Previous AAA arterial duplex on 08/08/2021: Abdominal Aorta Findings:  +-------------+------+----------+---------+----------+--------+------------  Location    AP    Trans (cm)PSV      Waveform  ThrombusComments                  (cm)            (cm/s)                                  +-------------+------+----------+---------+----------+--------+------------  Proximal    2.08  2.27      70                                      +-------------+------+----------+---------+----------+--------+------------  Mid         4.82  4.87      35                                      +-------------+------+----------+---------+----------+--------+------------  Distal      2.45  2.55      54                                      +-------------+------+----------+---------+----------+--------+------------  RT CIA Prox  1.6   1.9       123      monophasic                     +-------------+------+----------+---------+----------+--------+------------   RT CIA Distal                116      monophasic                     +-------------+------+----------+---------+----------+--------+------------   RT EIA Prox                  105      monophasic                     +-------------+------+----------+---------+----------+--------+------------  RT EIA Mid                   282      monophasic                     +-------------+------+----------+---------+----------+--------+------------   RT EIA Distal                325      monophasic                     +-------------+------+----------+---------+----------+--------+------------   LT CIA Prox                  34       monophasic                     +-------------+------+----------+---------+----------+--------+------------   LT CIA Distal possible occlusion      +-------------+------+----------+---------+----------+--------+------------   LT EIA Prox                  571      monophasic                     +-------------+------+----------+---------+----------+--------+------------  LT EIA Mid   2.3   2.2       650      monophasic                     +-------------+------+----------+---------+----------+--------+------------     Summary:  Abdominal Aorta: There is evidence of abnormal dilatation of the mid  Abdominal aorta. The largest aortic diameter remains essentially unchanged  compared to prior exam. Previous diameter measurement was obtained on 01/09/21. Ectatic bilateral CIA left  >right.  Stenosis: +--------------------+-------------+-------------------------+  Location            Stenosis     Comments                   +--------------------+-------------+-------------------------+  Left Common Iliac                possible distal occlusion  +--------------------+-------------+-------------------------+  Right External Iliac>50% stenosis                           +--------------------+-------------+-------------------------+  Left External Iliac >50% stenosis                           +--------------------+-------------+-------------------------+    ASSESSMENT/PLAN:: 82 y.o. male here for follow up for AAA and bilateral iliac artery stenosis  -pt AAA unchanged at 4.87cm, however, he is having lifestyle limiting LLE claudication.  His femoral pulse on the right is faintly palpable and I am unable to palpate the left femoral pulse.  His duplex today shows possible distal left CIA occlusion and > 50% bilateral external iliac artery stenosis.  Discussed with Dr. Randie Heinz and given pt has worsening symptoms that is interfering with his lifestyle, will send him for CTA abdomen/pelvis with runoff and return to see Dr. Randie Heinz for further discussions.  When he returns to see Dr. Randie Heinz, we will also get ABI.   -pt is not on statin  or asa-given AAA and PAD, he would benefit so I have sent an Rx to his pharmacy for crestor 10mg  daily as well as aspirin 81mg  daily.      , [facility] [phone]  Clinic MD:   [facility]

## 2022-05-07 ENCOUNTER — Other Ambulatory Visit: Payer: Self-pay

## 2022-05-07 ENCOUNTER — Ambulatory Visit: Payer: Medicare Other | Admitting: Physician Assistant

## 2022-05-07 ENCOUNTER — Ambulatory Visit (HOSPITAL_COMMUNITY)
Admission: RE | Admit: 2022-05-07 | Discharge: 2022-05-07 | Disposition: A | Discharge status: Home / Self Care | Payer: Medicare Other | Source: Ambulatory Visit | Attending: Vascular Surgery | Admitting: Vascular Surgery

## 2022-05-07 VITALS — BP 116/67 | HR 79 | Temp 98.1°F | Ht 73.0 in | Wt 199.0 lb

## 2022-05-07 DIAGNOSIS — I714 Abdominal aortic aneurysm, without rupture, unspecified: Secondary | ICD-10-CM

## 2022-05-07 DIAGNOSIS — I7409 Other arterial embolism and thrombosis of abdominal aorta: Secondary | ICD-10-CM

## 2022-05-07 DIAGNOSIS — I739 Peripheral vascular disease, unspecified: Secondary | ICD-10-CM

## 2022-05-07 MED ORDER — ASPIRIN 81 MG PO TBEC: 81.0000 mg | DELAYED_RELEASE_TABLET | Freq: Every day | ORAL | 3 refills | Status: DC

## 2022-05-07 MED ORDER — ROSUVASTATIN CALCIUM 10 MG PO TABS: 10.0000 mg | ORAL_TABLET | Freq: Every day | ORAL | 3 refills | Status: DC

## 2022-05-12 ENCOUNTER — Other Ambulatory Visit: Payer: Self-pay

## 2022-05-12 DIAGNOSIS — I739 Peripheral vascular disease, unspecified: Secondary | ICD-10-CM

## 2022-05-19 ENCOUNTER — Ambulatory Visit (HOSPITAL_COMMUNITY)
Admission: RE | Admit: 2022-05-19 | Discharge: 2022-05-19 | Disposition: A | Discharge status: Home / Self Care | Payer: Medicare Other | Source: Ambulatory Visit | Attending: Vascular Surgery | Admitting: Vascular Surgery

## 2022-05-19 DIAGNOSIS — K5730 Diverticulosis of large intestine without perforation or abscess without bleeding: Secondary | ICD-10-CM | POA: Diagnosis not present

## 2022-05-19 DIAGNOSIS — I724 Aneurysm of artery of lower extremity: Secondary | ICD-10-CM | POA: Diagnosis not present

## 2022-05-19 DIAGNOSIS — I714 Abdominal aortic aneurysm, without rupture, unspecified: Secondary | ICD-10-CM | POA: Diagnosis not present

## 2022-05-19 DIAGNOSIS — I745 Embolism and thrombosis of iliac artery: Secondary | ICD-10-CM | POA: Diagnosis not present

## 2022-05-19 DIAGNOSIS — I723 Aneurysm of iliac artery: Secondary | ICD-10-CM | POA: Diagnosis not present

## 2022-05-19 LAB — POCT I-STAT CREATININE: Creatinine, Ser: 0.6 mg/dL — ABNORMAL LOW (ref 0.61–1.24)

## 2022-05-19 MED ORDER — IOHEXOL 350 MG/ML SOLN
100.0000 mL | Freq: Once | INTRAVENOUS | Status: AC | PRN
  Administered 2022-05-19: 100 mL via INTRAVENOUS

## 2022-06-04 ENCOUNTER — Ambulatory Visit: Payer: Medicare Other | Admitting: Vascular Surgery

## 2022-06-04 ENCOUNTER — Encounter: Payer: Self-pay | Admitting: Vascular Surgery

## 2022-06-04 ENCOUNTER — Encounter (HOSPITAL_COMMUNITY): Payer: Self-pay

## 2022-06-04 ENCOUNTER — Ambulatory Visit (HOSPITAL_COMMUNITY)
Admission: RE | Admit: 2022-06-04 | Discharge: 2022-06-04 | Disposition: A | Discharge status: Home / Self Care | Payer: Medicare Other | Source: Ambulatory Visit | Attending: Vascular Surgery | Admitting: Vascular Surgery

## 2022-06-04 VITALS — BP 142/72 | HR 79 | Temp 98.3°F | Resp 20 | Ht 73.0 in | Wt 196.0 lb

## 2022-06-04 DIAGNOSIS — I714 Abdominal aortic aneurysm, without rupture, unspecified: Secondary | ICD-10-CM | POA: Diagnosis not present

## 2022-06-04 DIAGNOSIS — I739 Peripheral vascular disease, unspecified: Secondary | ICD-10-CM

## 2022-06-04 NOTE — Progress Notes (Signed)
 Patient ID: Graceson K Hrivnak, male   DOB: 02/15/1941, 81 y.o.   MRN: [id number]  Reason for Consult: Follow-up   Referred by Lalonde, John C, MD  Subjective:     HPI:  Christobal K Corallo is a 81 y.o. male With history of bilateral lower extremity pain left greater than right particularly with short distance claudication and abdominal aortic aneurysm.  He now follows up with CT scan. Claudication occurs after approximately 10 minutes.  He denies any rest pain or tissue loss.He continues working restoring racecar.  Past Medical History:  Diagnosis Date   Femoral hernia, bilateral, s/p lap repair 04/13/2012   Hemorrhoids    EXTERNAL   Hypertension    Family History  Problem Relation Age of Onset   Hypertension Father    Past Surgical History:  Procedure Laterality Date   COLONOSCOPY     COLONOSCOPY W/ POLYPECTOMY      Short Social History:  Social History   Tobacco Use   Smoking status: Never    Passive exposure: Never   Smokeless tobacco: Never  Substance Use Topics   Alcohol use: No    Comment: rare    No Known Allergies  Current Outpatient Medications  Medication Sig Dispense Refill   aspirin EC 81 MG tablet Take 1 tablet (81 mg total) by mouth daily. Swallow whole. 90 tablet 3   doxazosin (CARDURA) 4 MG tablet Take 1 tablet (4 mg total) by mouth daily. 90 tablet 3   finasteride (PROSCAR) 5 MG tablet TAKE 1 TABLET (5 MG TOTAL) BY MOUTH DAILY. 90 tablet 0   lisinopril-hydrochlorothiazide (ZESTORETIC) 10-12.5 MG tablet Take 1 tablet by mouth daily. 90 tablet 3   rosuvastatin (CRESTOR) 10 MG tablet Take 1 tablet (10 mg total) by mouth daily. 90 tablet 3   No current facility-administered medications for this visit.    Review of Systems  Constitutional:  Constitutional negative. HENT: HENT negative.  Eyes: Eyes negative.  Respiratory: Respiratory negative.  Cardiovascular: Positive for claudication.  GI: Gastrointestinal negative.  Musculoskeletal:  Musculoskeletal negative.  Skin: Skin negative.  Neurological: Neurological negative. Hematologic: Hematologic/lymphatic negative.  Psychiatric: Psychiatric negative.        Objective:  Objective   Vitals:   06/04/22 1537  BP: (!) 142/72  Pulse: 79  Resp: 20  Temp: 98.3 F (36.8 C)  SpO2: 95%  Weight: 196 lb (88.9 kg)  Height: 6' 1" (1.854 m)   Body mass index is 25.86 kg/m.  Physical Exam HENT:     Head: Normocephalic.     Mouth/Throat:     Mouth: Mucous membranes are moist.  Eyes:     Pupils: Pupils are equal, round, and reactive to light.  Cardiovascular:     Rate and Rhythm: Normal rate.     Pulses:          Femoral pulses are 0 on the right side and 0 on the left side.      Popliteal pulses are 0 on the right side and 0 on the left side.  Pulmonary:     Effort: Pulmonary effort is normal.  Abdominal:     General: Abdomen is flat.     Palpations: Abdomen is soft. There is no mass.  Musculoskeletal:        General: Normal range of motion.     Right lower leg: No edema.     Left lower leg: No edema.  Skin:    General: Skin is warm.     Capillary   Refill: Capillary refill takes less than 2 seconds.  Neurological:     General: No focal deficit present.     Mental Status: He is alert.  Psychiatric:        Mood and Affect: Mood normal.        Behavior: Behavior normal.        Thought Content: Thought content normal.        Judgment: Judgment normal.     Data: CT IMPRESSION: *Interval increase in size of fusiform infrarenal abdominal aortic aneurysm now measuring 5.5 cm compared to 4.7 cm on prior exam. 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 *Unchanged aneurysmal dilatation of the left internal iliac artery measuring up to 1.1 cm. 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 *Complete to near complete occlusion of the mid left external iliac artery. *Long segment occlusion of  the left SFA. Flow reconstituted in the distal 1/3. *Severe stenosis of the left tibioperoneal trunk. Three-vessel runoff to the left ankle. *Unchanged severe stenosis of the distal right external iliac artery. *Two-vessel runoff to the right ankle through the posterior tibial and peroneal arteries.   NON-VASCULAR   *No acute abnormality of the abdomen or pelvis. *Prostatomegaly. *Unchanged 2.6 x 2.1 cm right posterolateral bladder diverticulum. 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  Patient will require endovascular aneurysm repair and we discussed the risk benefits alternatives as well as the need for possible cut down the bilateral groins with more likely chance on the left and treatment of the occluded left external neck artery at the time of the procedure treatment of stenosed extrailiac artery on the right at the time of procedure.  We will get cardiac clearance to get the patient scheduled in the near future.  We discussed the signs and symptoms of aortic rupture and he demonstrates good understanding.  Short of this we will repair him electively with endovascular repair.      Varie Machamer Christopher Lashante Fryberger MD [facility] [place]   

## 2022-06-04 NOTE — H&P (View-Only) (Signed)
Patient ID: Jacob Bowen, male   DOB: 07/17/1940, 82 y.o.   MRN: [id number]  Reason for Consult: Follow-up   Referred by Denita Lung, MD  Subjective:     HPI:  Jacob Bowen is a 82 y.o. male With history of bilateral lower extremity pain left greater than right particularly with short distance claudication and abdominal aortic aneurysm.  He now follows up with CT scan. Claudication occurs after approximately 10 minutes.  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  Past Medical History:  Diagnosis Date   Femoral hernia, bilateral, s/p lap repair 04/13/2012   Hemorrhoids    EXTERNAL   Hypertension    Family History  Problem Relation Age of Onset   Hypertension Father    Past Surgical History:  Procedure Laterality Date   COLONOSCOPY     COLONOSCOPY W/ POLYPECTOMY      Short Social History:  Social History   Tobacco Use   Smoking status: Never    Passive exposure: Never   Smokeless tobacco: Never  Substance Use Topics   Alcohol use: No    Comment: rare    No Known Allergies  Current Outpatient Medications  Medication Sig Dispense Refill   aspirin EC 81 MG tablet Take 1 tablet (81 mg total) by mouth daily. Swallow whole. 90 tablet 3   doxazosin (CARDURA) 4 MG tablet Take 1 tablet (4 mg total) by mouth daily. 90 tablet 3   finasteride (PROSCAR) 5 MG tablet TAKE 1 TABLET (5 MG TOTAL) BY MOUTH DAILY. 90 tablet 0   lisinopril-hydrochlorothiazide (ZESTORETIC) 10-12.5 MG tablet Take 1 tablet by mouth daily. 90 tablet 3   rosuvastatin (CRESTOR) 10 MG tablet Take 1 tablet (10 mg total) by mouth daily. 90 tablet 3   No current facility-administered medications for this visit.    Review of Systems  Constitutional:  Constitutional negative. HENT: HENT negative.  Eyes: Eyes negative.  Respiratory: Respiratory negative.  Cardiovascular: Positive for claudication.  GI: Gastrointestinal negative.  Musculoskeletal:  Musculoskeletal negative.  Skin: Skin negative.  Neurological: Neurological negative. Hematologic: Hematologic/lymphatic negative.  Psychiatric: Psychiatric negative.        Objective:  Objective   Vitals:   06/04/22 1537  BP: (!) 142/72  Pulse: 79  Resp: 20  Temp: 98.3 F (36.8 C)  SpO2: 95%  Weight: 196 lb (88.9 kg)  Height: '6\' 1"'$  (1.854 m)   Body mass index is 25.86 kg/m.  Physical Exam HENT:     Head: Normocephalic.     Mouth/Throat:     Mouth: Mucous membranes are moist.  Eyes:     Pupils: Pupils are equal, round, and reactive to light.  Cardiovascular:     Rate and Rhythm: Normal rate.     Pulses:          Femoral pulses are 0 on the right side and 0 on the left side.      Popliteal pulses are 0 on the right side and 0 on the left side.  Pulmonary:     Effort: Pulmonary effort is normal.  Abdominal:     General: Abdomen is flat.     Palpations: Abdomen is soft. There is no mass.  Musculoskeletal:        General: Normal range of motion.     Right lower leg: No edema.     Left lower leg: No edema.  Skin:    General: Skin is warm.     Capillary  Refill: Capillary refill takes less than 2 seconds.  Neurological:     General: No focal deficit present.     Mental Status: He is alert.  Psychiatric:        Mood and Affect: Mood normal.        Behavior: Behavior normal.        Thought Content: Thought content normal.        Judgment: Judgment normal.     Data: CT IMPRESSION: *Interval increase in size of fusiform infrarenal abdominal aortic aneurysm now measuring 5.5 cm compared to 4.7 cm on prior exam. 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 *Unchanged aneurysmal dilatation of the left internal iliac artery measuring up to 1.1 cm. 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 *Complete to near complete occlusion of the mid left external iliac artery. *Long segment occlusion of  the left SFA. Flow reconstituted in the distal 1/3. *Severe stenosis of the left tibioperoneal trunk. Three-vessel runoff to the left ankle. *Unchanged severe stenosis of the distal right external iliac artery. *Two-vessel runoff to the right ankle through the posterior tibial and peroneal arteries.   NON-VASCULAR   *No acute abnormality of the abdomen or pelvis. *Prostatomegaly. *Unchanged 2.6 x 2.1 cm right posterolateral bladder diverticulum. 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  Patient will require endovascular aneurysm repair and we discussed the risk benefits alternatives as well as the need for possible cut down the bilateral groins with more likely chance on the left and treatment of the occluded left external neck artery at the time of the procedure treatment of stenosed extrailiac artery on the right at the time of procedure.  We will get cardiac clearance to get the patient scheduled in the near future.  We discussed the signs and symptoms of aortic rupture and he demonstrates good understanding.  Short of this we will repair him electively with endovascular repair.      Waynetta Sandy MD [facility]

## 2022-06-09 ENCOUNTER — Other Ambulatory Visit: Payer: Self-pay

## 2022-06-09 DIAGNOSIS — I714 Abdominal aortic aneurysm, without rupture, unspecified: Secondary | ICD-10-CM

## 2022-06-10 ENCOUNTER — Ambulatory Visit: Payer: Medicare Other | Attending: Internal Medicine | Admitting: Internal Medicine

## 2022-06-10 ENCOUNTER — Encounter: Payer: Self-pay | Admitting: Internal Medicine

## 2022-06-10 VITALS — BP 118/74 | HR 88 | Ht 74.0 in | Wt 198.0 lb

## 2022-06-10 DIAGNOSIS — I7409 Other arterial embolism and thrombosis of abdominal aorta: Secondary | ICD-10-CM | POA: Diagnosis not present

## 2022-06-10 DIAGNOSIS — I714 Abdominal aortic aneurysm, without rupture, unspecified: Secondary | ICD-10-CM | POA: Diagnosis not present

## 2022-06-10 NOTE — Patient Instructions (Signed)
Medication Instructions:   *If you need a refill on your cardiac medications before your next appointment, please call your pharmacy*   Lab Work: [place] [facility] ECHO  If you have labs (blood work) drawn today and your tests are completely normal, you will receive your results only by: [place] (if you have MyChart) OR A paper copy in the mail If you have any lab test that is abnormal or we need to change your treatment, we will call you to review the results.   Testing/Procedures: LABS SAME DAY  Your physician has requested that you have an echocardiogram. Echocardiography is a painless test that uses sound waves to create images of your heart. It provides your doctor with information about the size and shape of your heart and how well your heart's chambers and valves are working. This procedure takes approximately one hour. There are no restrictions for this procedure. Please do NOT wear cologne, perfume, aftershave, or lotions (deodorant is allowed). Please arrive 15 minutes prior to your appointment time.    Follow-Up: At [facility], you and your health needs are our priority.  As part of our continuing mission to provide you with exceptional heart care, we have created designated Provider Care Teams.  These Care Teams include your primary Cardiologist (physician) and Advanced Practice Providers (APPs -  Physician Assistants and Nurse Practitioners) who all work together to provide you with the care you need, when you need it.  We recommend signing up for the patient portal called "MyChart".  Sign up information is provided on this After Visit Summary.  MyChart is used to connect with patients for Virtual Visits (Telemedicine).  Patients are able to view lab/test results, encounter notes, upcoming appointments, etc.  Non-urgent messages can be sent to your provider as well.   To learn more about what you can do with MyChart, go to [email or web address].

## 2022-06-10 NOTE — Progress Notes (Signed)
Cardiology Office Note   Date:  06/10/2022   ID:  Azrael, Maddix 1941-03-12, MRN [id number]  PCP:  Denita Lung, MD  Cardiologist:   Dorris Carnes, MD   Pt presents for preop risk assessment     History of Present Illness: Jacob Bowen is a 82 y.o. 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 Swallow whole.   doxazosin (CARDURA) 4 MG tablet Take 1 tablet (4 mg total) by mouth daily.   finasteride (PROSCAR) 5 MG tablet TAKE 1 TABLET (5 MG TOTAL) BY MOUTH DAILY.   lisinopril-hydrochlorothiazide (ZESTORETIC) 10-12.5 MG tablet Take 1 tablet by mouth daily.   rosuvastatin (CRESTOR) 10 MG tablet Take 1 tablet (10 mg total) by mouth daily.     Allergies:   Patient has no known allergies.   Past Medical History:  Diagnosis Date   Femoral hernia, bilateral, s/p lap repair 04/13/2012   Hemorrhoids    EXTERNAL   Hypertension     Past Surgical History:  Procedure Laterality Date   COLONOSCOPY     COLONOSCOPY W/ POLYPECTOMY       Social History:  The patient  reports that he has never smoked. He has never been exposed to tobacco smoke. He has never used smokeless tobacco. He reports current drug use. Drug: Marijuana. He reports that he does not drink alcohol.   Family History:  The patient's family history includes Hypertension in his father.    ROS:  Please see the history of present illness. All other systems are reviewed and  Negative to the above problem except as noted.    PHYSICAL EXAM: VS:  BP 118/74   Pulse 88   Ht 6'  2" (1.88 m)   Wt 198 lb (89.8 kg)   SpO2 96%   BMI 25.42 kg/m   GEN: Well nourished, well developed, in no acute distress  HEENT: normal  Neck: no JVD, no carotid bruit Cardiac: RRR; no murmurs,  No LE  edema  Respiratory:  clear to auscultation bilaterally,  GI: soft, nontender, nondistended, + BS  No hepatomegaly  MS: no deformity Moving all extremities   Skin: warm and dry, no rash Neuro:  Strength and sensation are intact Psych: euthymic mood, full affect   EKG:  EKG is ordered today.  NSR   88 bpm      Lipid Panel    Component Value Date/Time   CHOL 180 07/16/2021 1209   TRIG 85 07/16/2021 1209   HDL 52 07/16/2021 1209   CHOLHDL 3.5 07/16/2021 1209   CHOLHDL 5.4 (H) 06/06/2016 1038   VLDL 44 (H) 06/06/2016 1038   LDLCALC 112 (H) 07/16/2021 1209      Wt Readings from Last 3 Encounters:  06/10/22 198 lb (89.8 kg)  06/04/22 196 lb (88.9 kg)  05/07/22 199 lb (90.3 kg)      ASSESSMENT AND PLAN:  1  Preop cardiac risk assessment   Pt to undergo endovascular repair of AAA    He denies anginal symptoms    Able to work over 4 METS   no CP    No SOB   In SR    From a cardiac standpoint he is a relatively low risk for major cardiac event    Would get echo to confirm LVEF     IF normal OK to proceed without further testing   2  HL  Just started on statin   Will get labs when he comes in for echo  3  HTN  BP is controlled         Current medicines are reviewed at length with the patient today.  The patient does not have concerns regarding medicines.  Signed, Dorris Carnes, MD  06/10/2022 3:08 PM    [facility] [place], [place],   [address] Phone: [phone]; Fax: [phone]

## 2022-06-30 ENCOUNTER — Encounter (HOSPITAL_COMMUNITY)
Admission: RE | Admit: 2022-06-30 | Discharge: 2022-06-30 | Disposition: A | Discharge status: Home / Self Care | Payer: Medicare Other | Source: Ambulatory Visit | Attending: Vascular Surgery | Admitting: Vascular Surgery

## 2022-06-30 ENCOUNTER — Ambulatory Visit: Payer: Medicare Other

## 2022-06-30 ENCOUNTER — Other Ambulatory Visit: Payer: Self-pay

## 2022-06-30 ENCOUNTER — Encounter (HOSPITAL_COMMUNITY): Payer: Self-pay

## 2022-06-30 ENCOUNTER — Ambulatory Visit (HOSPITAL_BASED_OUTPATIENT_CLINIC_OR_DEPARTMENT_OTHER): Payer: Medicare Other

## 2022-06-30 VITALS — BP 144/72 | HR 91 | Temp 98.2°F | Resp 18 | Ht 74.0 in | Wt 198.7 lb

## 2022-06-30 DIAGNOSIS — R0600 Dyspnea, unspecified: Secondary | ICD-10-CM | POA: Insufficient documentation

## 2022-06-30 DIAGNOSIS — J9811 Atelectasis: Secondary | ICD-10-CM | POA: Diagnosis not present

## 2022-06-30 DIAGNOSIS — E785 Hyperlipidemia, unspecified: Secondary | ICD-10-CM | POA: Insufficient documentation

## 2022-06-30 DIAGNOSIS — I7143 Infrarenal abdominal aortic aneurysm, without rupture: Secondary | ICD-10-CM | POA: Insufficient documentation

## 2022-06-30 DIAGNOSIS — N400 Enlarged prostate without lower urinary tract symptoms: Secondary | ICD-10-CM | POA: Insufficient documentation

## 2022-06-30 DIAGNOSIS — I358 Other nonrheumatic aortic valve disorders: Secondary | ICD-10-CM | POA: Diagnosis not present

## 2022-06-30 DIAGNOSIS — I714 Abdominal aortic aneurysm, without rupture, unspecified: Secondary | ICD-10-CM

## 2022-06-30 DIAGNOSIS — Z8249 Family history of ischemic heart disease and other diseases of the circulatory system: Secondary | ICD-10-CM | POA: Diagnosis not present

## 2022-06-30 DIAGNOSIS — Z86010 Personal history of colonic polyps: Secondary | ICD-10-CM | POA: Diagnosis not present

## 2022-06-30 DIAGNOSIS — I745 Embolism and thrombosis of iliac artery: Secondary | ICD-10-CM | POA: Diagnosis not present

## 2022-06-30 DIAGNOSIS — K5730 Diverticulosis of large intestine without perforation or abscess without bleeding: Secondary | ICD-10-CM | POA: Insufficient documentation

## 2022-06-30 DIAGNOSIS — I7121 Aneurysm of the ascending aorta, without rupture: Secondary | ICD-10-CM | POA: Insufficient documentation

## 2022-06-30 DIAGNOSIS — R0609 Other forms of dyspnea: Secondary | ICD-10-CM

## 2022-06-30 DIAGNOSIS — I723 Aneurysm of iliac artery: Secondary | ICD-10-CM | POA: Insufficient documentation

## 2022-06-30 DIAGNOSIS — D62 Acute posthemorrhagic anemia: Secondary | ICD-10-CM | POA: Diagnosis not present

## 2022-06-30 DIAGNOSIS — I10 Essential (primary) hypertension: Secondary | ICD-10-CM | POA: Insufficient documentation

## 2022-06-30 DIAGNOSIS — N323 Diverticulum of bladder: Secondary | ICD-10-CM | POA: Insufficient documentation

## 2022-06-30 DIAGNOSIS — I6523 Occlusion and stenosis of bilateral carotid arteries: Secondary | ICD-10-CM | POA: Insufficient documentation

## 2022-06-30 DIAGNOSIS — Z01818 Encounter for other preprocedural examination: Secondary | ICD-10-CM | POA: Insufficient documentation

## 2022-06-30 DIAGNOSIS — I7409 Other arterial embolism and thrombosis of abdominal aorta: Secondary | ICD-10-CM

## 2022-06-30 DIAGNOSIS — R252 Cramp and spasm: Secondary | ICD-10-CM | POA: Diagnosis not present

## 2022-06-30 DIAGNOSIS — I739 Peripheral vascular disease, unspecified: Secondary | ICD-10-CM | POA: Diagnosis not present

## 2022-06-30 DIAGNOSIS — M109 Gout, unspecified: Secondary | ICD-10-CM | POA: Diagnosis not present

## 2022-06-30 DIAGNOSIS — Z7982 Long term (current) use of aspirin: Secondary | ICD-10-CM | POA: Diagnosis not present

## 2022-06-30 DIAGNOSIS — Z83719 Family history of colon polyps, unspecified: Secondary | ICD-10-CM | POA: Diagnosis not present

## 2022-06-30 DIAGNOSIS — I70223 Atherosclerosis of native arteries of extremities with rest pain, bilateral legs: Secondary | ICD-10-CM | POA: Diagnosis not present

## 2022-06-30 DIAGNOSIS — Z79899 Other long term (current) drug therapy: Secondary | ICD-10-CM | POA: Diagnosis not present

## 2022-06-30 HISTORY — DX: Abdominal aortic aneurysm, without rupture, unspecified: I71.40

## 2022-06-30 LAB — TYPE AND SCREEN
ABO/RH(D): O POS
Antibody Screen: NEGATIVE

## 2022-06-30 LAB — CBC
HCT: 42.5 % (ref 39.0–52.0)
Hemoglobin: 14.4 g/dL (ref 13.0–17.0)
MCH: 31.1 pg (ref 26.0–34.0)
MCHC: 33.9 g/dL (ref 30.0–36.0)
MCV: 91.8 fL (ref 80.0–100.0)
Platelets: 226 10*3/uL (ref 150–400)
RBC: 4.63 MIL/uL (ref 4.22–5.81)
RDW: 12.7 % (ref 11.5–15.5)
WBC: 8 10*3/uL (ref 4.0–10.5)
nRBC: 0 % (ref 0.0–0.2)

## 2022-06-30 LAB — COMPREHENSIVE METABOLIC PANEL
ALT: 16 U/L (ref 0–44)
AST: 22 U/L (ref 15–41)
Albumin: 4 g/dL (ref 3.5–5.0)
Alkaline Phosphatase: 68 U/L (ref 38–126)
Anion gap: 6 (ref 5–15)
BUN: 12 mg/dL (ref 8–23)
CO2: 30 mmol/L (ref 22–32)
Calcium: 9 mg/dL (ref 8.9–10.3)
Chloride: 103 mmol/L (ref 98–111)
Creatinine, Ser: 0.86 mg/dL (ref 0.61–1.24)
GFR, Estimated: >60 mL/min (ref >60)
Glucose, Bld: 115 mg/dL — ABNORMAL HIGH (ref 70–99)
Potassium: 3.7 mmol/L (ref 3.5–5.1)
Sodium: 139 mmol/L (ref 135–145)
Total Bilirubin: 0.7 mg/dL (ref 0.3–1.2)
Total Protein: 7.1 g/dL (ref 6.5–8.1)

## 2022-06-30 LAB — URINALYSIS, MICROSCOPIC (REFLEX): Bacteria, UA: NONE SEEN

## 2022-06-30 LAB — URINALYSIS, ROUTINE W REFLEX MICROSCOPIC
Bilirubin Urine: NEGATIVE
Glucose, UA: NEGATIVE mg/dL
Ketones, ur: NEGATIVE mg/dL
Leukocytes,Ua: NEGATIVE
Nitrite: NEGATIVE
Protein, ur: NEGATIVE mg/dL
Specific Gravity, Urine: 1.01 (ref 1.005–1.030)
pH: 5.5 (ref 5.0–8.0)

## 2022-06-30 LAB — ECHOCARDIOGRAM COMPLETE
Area-P 1/2: 3.42 cm2
Height: 74 in
S' Lateral: 4.3 cm
Weight: 3179.2 oz

## 2022-06-30 LAB — SURGICAL PCR SCREEN
MRSA, PCR: NEGATIVE
Staphylococcus aureus: POSITIVE — AB

## 2022-06-30 LAB — PROTIME-INR
INR: 1 (ref 0.8–1.2)
Prothrombin Time: 13.5 seconds (ref 11.4–15.2)

## 2022-06-30 LAB — APTT: aPTT: 30 seconds (ref 24–36)

## 2022-06-30 NOTE — Pre-Procedure Instructions (Signed)
Surgical Instructions    Your procedure is scheduled on Thursday, February 29.  Report to [facility] Main Entrance "A" at 5:30 A.M., then check in with the Admitting office.  Call this number if you have problems the morning of surgery:  [phone]   If you have any questions prior to your surgery date call [phone]: Open Monday-Friday 8am-4pm If you experience any cold or flu symptoms such as cough, fever, chills, shortness of breath, etc. between now and your scheduled surgery, please notify us at the above number     Remember:  Do not eat or drink after midnight the night before your surgery     Take these medicines the morning of surgery with A SIP OF WATER:  doxazosin (CARDURA)  finasteride (PROSCAR) rosuvastatin (CRESTOR)  acetaminophen (TYLENOL) if needed  Follow your surgeon's instructions on whether to stop taking Aspirin prior to surgery.  If no instructions were given by your surgeon then you will need to call the office to get those instructions.     As of today, STOP taking any Aleve, Naproxen, Ibuprofen, Motrin, Advil, Goody's, BC's, all herbal medications, fish oil, and all vitamins.             [facility] is not responsible for any belongings or valuables.    Do NOT Smoke (Tobacco/Vaping)  24 hours prior to your procedure  If you use a CPAP at night, you may bring your mask for your overnight stay.   Contacts, glasses, hearing aids, dentures or partials may not be worn into surgery, please bring cases for these belongings   For patients admitted to the hospital, discharge time will be determined by your treatment team.   Patients discharged the day of surgery will not be allowed to drive home, and someone needs to stay with them for 24 hours.   SURGICAL WAITING ROOM VISITATION Patients having surgery or a procedure may have no more than 2 support people in the waiting area - these visitors may rotate.   Children under the age of 64 must have an  adult with them who is not the patient. If the patient needs to stay at the hospital during part of their recovery, the visitor guidelines for inpatient rooms apply. Pre-op nurse will coordinate an appropriate time for 1 support person to accompany patient in pre-op.  This support person may not rotate.   Please refer to [email or web address] for the visitor guidelines for Inpatients (after your surgery is over and you are in a regular room).    Special instructions:    Oral Hygiene is also important to reduce your risk of infection.  Remember - BRUSH YOUR TEETH THE MORNING OF SURGERY WITH YOUR REGULAR TOOTHPASTE   [facility]- Preparing For Surgery  Before surgery, you can play an important role. Because skin is not sterile, your skin needs to be as free of germs as possible. You can reduce the number of germs on your skin by washing with CHG (chlorahexidine gluconate) Soap before surgery.  CHG is an antiseptic cleaner which kills germs and bonds with the skin to continue killing germs even after washing.     Please do not use if you have an allergy to CHG or antibacterial soaps. If your skin becomes reddened/irritated stop using the CHG.  Do not shave (including legs and underarms) for at least 48 hours prior to first CHG shower. It is OK to shave your face.  Please follow these instructions carefully.     Shower  the NIGHT BEFORE SURGERY and the MORNING OF SURGERY with CHG Soap.   If you chose to wash your hair, wash your hair first as usual with your normal shampoo. After you shampoo, rinse your hair and body thoroughly to remove the shampoo.  Then [employer] and genitals (private parts) with your normal soap and rinse thoroughly to remove soap.  After that Use CHG Soap as you would any other liquid soap. You can apply CHG directly to the skin and wash gently with a scrungie or a clean washcloth.   Apply the CHG Soap to your body ONLY FROM  THE NECK DOWN.  Do not use on open wounds or open sores. Avoid contact with your eyes, ears, mouth and genitals (private parts). Wash Face and genitals (private parts)  with your normal soap.   Wash thoroughly, paying special attention to the area where your surgery will be performed.  Thoroughly rinse your body with warm water from the neck down.  DO NOT shower/wash with your normal soap after using and rinsing off the CHG Soap.  Pat yourself dry with a CLEAN TOWEL.  Wear CLEAN PAJAMAS to bed the night before surgery  Place CLEAN SHEETS on your bed the night before your surgery  DO NOT SLEEP WITH PETS.   Day of Surgery:  Take a shower with CHG soap. Wear Clean/Comfortable clothing the morning of surgery Do not wear jewelry or makeup. Do not wear lotions, powders, perfumes/cologne or deodorant. Do not shave 48 hours prior to surgery.  Men may shave face and neck. Do not bring valuables to the hospital. Do not wear nail polish, gel polish, artificial nails, or any other type of covering on natural nails (fingers and toes) If you have artificial nails or gel coating that need to be removed by a nail salon, please have this removed prior to surgery. Artificial nails or gel coating may interfere with anesthesia's ability to adequately monitor your vital signs. Remember to brush your teeth WITH YOUR REGULAR TOOTHPASTE.    If you received a COVID test during your pre-op visit, it is requested that you wear a mask when out in public, stay away from anyone that may not be feeling well, and notify your surgeon if you develop symptoms. If you have been in contact with anyone that has tested positive in the last 10 days, please notify your surgeon.    Please read over the following fact sheets that you were given.

## 2022-06-30 NOTE — Progress Notes (Signed)
PCP - Denita Lung, MD  Cardiologist - Dorris Carnes- sent for cardiac clearance by Dr Donzetta Matters  PPM/ICD - denies   Chest x-ray - N/A EKG - 06/10/22 Stress Test - denies ECHO - 06/30/2022 - pt scheduled for appt today Cardiac Cath - denies  Sleep Study - denies  Fasting Blood Sugar - N/A   Blood Thinner Instructions: N/A Aspirin Instructions: Follow your surgeon's instructions on when to stop Aspirin.  If no instructions were given by your surgeon then you will need to call the office to get those instructions.     ERAS Protcol - NPO order   COVID TEST- N/A   Anesthesia review: yes, cardiac clearance. Pt having ECHO done on 06/30/22 at [facility] for cardiac clearance.   Patient denies shortness of breath, fever, cough and chest pain at PAT appointment   All instructions explained to the patient, with a verbal understanding of the material. Patient agrees to go over the instructions while at home for a better understanding.  The opportunity to ask questions was provided.

## 2022-07-01 ENCOUNTER — Encounter (HOSPITAL_COMMUNITY): Payer: Self-pay

## 2022-07-01 LAB — NMR, LIPOPROFILE
Cholesterol, Total: 139 mg/dL (ref 100–199)
HDL Particle Number: 31.9 umol/L (ref >=30.5)
HDL-C: 51 mg/dL (ref >39)
LDL Particle Number: 792 nmol/L (ref <1000)
LDL Size: 20.5 nm — ABNORMAL LOW (ref >20.5)
LDL-C (NIH Calc): 62 mg/dL (ref 0–99)
LP-IR Score: 32 (ref <=45)
Small LDL Particle Number: 505 nmol/L (ref <=527)
Triglycerides: 151 mg/dL — ABNORMAL HIGH (ref 0–149)

## 2022-07-01 LAB — BASIC METABOLIC PANEL
BUN/Creatinine Ratio: 16 (ref 10–24)
BUN: 12 mg/dL (ref 8–27)
CO2: 24 mmol/L (ref 20–29)
Calcium: 9.3 mg/dL (ref 8.6–10.2)
Chloride: 104 mmol/L (ref 96–106)
Creatinine, Ser: 0.77 mg/dL (ref 0.76–1.27)
Glucose: 77 mg/dL (ref 70–99)
Potassium: 3.8 mmol/L (ref 3.5–5.2)
Sodium: 143 mmol/L (ref 134–144)
eGFR: 91 mL/min/{1.73_m2} (ref >59)

## 2022-07-01 NOTE — Progress Notes (Signed)
Anesthesia Chart Review:  Case: [id number] Date/Time: 07/03/22 0715   Procedure: ABDOMINAL AORTIC ENDOVASCULAR STENT GRAFT   Anesthesia type: General   Pre-op diagnosis: Abdominal aortic aneurysm without rupture   Location: [facility] OR ROOM 16 / [facility] OR   Surgeons: Waynetta Sandy, MD       DISCUSSION: Patient is a 82 year old male scheduled for the above procedure.  History includes never smoker, HTN, AAA & left iliofemoral aneurysm, inguinal hernia repair.   He had a preoperative cardiology evaluation by Dr. Dorris Carnes on 06/10/22. She wrote, "Preop cardiac risk assessment   Pt to undergo endovascular repair of AAA    He denies anginal symptoms    Able to work over 4 METS   no CP    No SOB   In SR    From a cardiac standpoint he is a relatively low risk for major cardiac event    Would get echo to confirm LVEF     IF normal OK to proceed without further testing". Echo was done on 06/30/22 and showed LVEF 55-60%, no regional wall motion abnormalities, normal RVSF, no significant valvular disease, aortic root 38 mm, ascending aorta 42 mm.   Anesthesia team to evaluate on the day of surgery.    VS: BP (!) 144/72   Pulse 91   Temp 36.8 C   Resp 18   Ht '6\' 2"'$  (1.88 m)   Wt 90.1 kg   SpO2 97%   BMI 25.51 kg/m    PROVIDERS: Denita Lung, MD is PCP    LABS: Labs reviewed: Acceptable for surgery. (all labs ordered are listed, but only abnormal results are displayed)  Labs Reviewed  SURGICAL PCR SCREEN - Abnormal; Notable for the following components:      Result Value   Staphylococcus aureus POSITIVE (*)    All other components within normal limits  COMPREHENSIVE METABOLIC PANEL - Abnormal; Notable for the following components:   Glucose, Bld 115 (*)    All other components within normal limits  URINALYSIS, ROUTINE W REFLEX MICROSCOPIC - Abnormal; Notable for the following components:   Hgb urine dipstick TRACE (*)    All other components within normal limits  CBC   PROTIME-INR  APTT  URINALYSIS, MICROSCOPIC (REFLEX)  TYPE AND SCREEN    IMAGES: CTA Ao+BiFem 05/19/22: IMPRESSION: *Interval increase in size of fusiform infrarenal abdominal aortic aneurysm now measuring 5.5 cm compared to 4.7 cm on prior exam. 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 *Unchanged aneurysmal dilatation of the left internal iliac artery measuring up to 1.1 cm. 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 *Complete to near complete occlusion of the mid left external iliac artery. *Long segment occlusion of the left SFA. Flow reconstituted in the distal 1/3. *Severe stenosis of the left tibioperoneal trunk. Three-vessel runoff to the left ankle. *Unchanged severe stenosis of the distal right external iliac artery. *Two-vessel runoff to the right ankle through the posterior tibial and peroneal arteries.   NON-VASCULAR *No acute abnormality of the abdomen or pelvis. *Prostatomegaly. *Unchanged 2.6 x 2.1 cm right posterolateral bladder diverticulum. *Mild diverticulosis of the descending colon without evidence of acute diverticulitis   EKG: 06/10/22: NSR   CV: Echo 06/30/22: IMPRESSIONS   1. Left ventricular ejection fraction, by estimation, is 55 to 60%. The  left ventricle has normal function. The left ventricle has no regional  wall motion abnormalities. Left ventricular diastolic parameters were  normal.   2. Right ventricular systolic function is normal. The right ventricular  size is normal.   3. The mitral valve is normal in structure. No evidence of mitral valve  regurgitation. No evidence of mitral stenosis.   4. The aortic valve is tricuspid. Aortic valve regurgitation is not  visualized. Aortic valve sclerosis/calcification is present, without any  evidence of aortic stenosis.   5. Aortic dilatation noted. There is borderline dilatation of the aortic  root, measuring 38  mm. There is mild dilatation of the ascending aorta,  measuring 42 mm.    US Carotid 02/25/22: IMPRESSION: 1. Mild (1-49%) stenosis proximal right internal carotid artery secondary to trace heterogeneous atherosclerotic plaque. 2. Mild (1-49%) stenosis proximal left internal carotid artery secondary to moderate echogenic/calcified atherosclerotic plaque. 3. Vertebral arteries are patent with normal antegrade flow.   Past Medical History:  Diagnosis Date   AAA (abdominal aortic aneurysm) (HCC)    Femoral hernia, bilateral, s/p lap repair 04/13/2012   Hemorrhoids    EXTERNAL   Hypertension     Past Surgical History:  Procedure Laterality Date   COLONOSCOPY     COLONOSCOPY W/ POLYPECTOMY     INGUINAL HERNIA REPAIR Right     MEDICATIONS:  acetaminophen (TYLENOL) 325 MG tablet   aspirin EC 81 MG tablet   doxazosin (CARDURA) 4 MG tablet   finasteride (PROSCAR) 5 MG tablet   lactose free nutrition (BOOST) LIQD   lisinopril-hydrochlorothiazide (ZESTORETIC) 10-12.5 MG tablet   rosuvastatin (CRESTOR) 10 MG tablet   No current facility-administered medications for this encounter.    Myra Gianotti, PA-C Surgical Short Stay/Anesthesiology [facility] Phone [phone] [employer] Phone [phone] 07/01/2022 2:56 PM

## 2022-07-01 NOTE — Anesthesia Preprocedure Evaluation (Signed)
Anesthesia Evaluation  Patient identified by MRN, date of birth, ID band Patient awake    Reviewed: Allergy & Precautions, NPO status , Patient's Chart, lab work & pertinent test results  Airway Mallampati: II  TM Distance: >3 FB Neck ROM: Full    Dental  (+) Teeth Intact, Dental Advisory Given   Pulmonary    breath sounds clear to auscultation       Cardiovascular hypertension, Pt. on medications + Peripheral Vascular Disease   Rhythm:Regular Rate:Normal     Neuro/Psych    GI/Hepatic negative GI ROS, Neg liver ROS,,,  Endo/Other  negative endocrine ROS    Renal/GU negative Renal ROS     Musculoskeletal negative musculoskeletal ROS (+)    Abdominal   Peds  Hematology negative hematology ROS (+)   Anesthesia Other Findings   Reproductive/Obstetrics                             Anesthesia Physical Anesthesia Plan  ASA: 4  Anesthesia Plan: General   Post-op Pain Management: Minimal or no pain anticipated   Induction: Intravenous  PONV Risk Score and Plan: 3 and Ondansetron and Treatment may vary due to age or medical condition  Airway Management Planned: Oral ETT  Additional Equipment: Arterial line  Intra-op Plan:   Post-operative Plan: Extubation in OR  Informed Consent: I have reviewed the patients History and Physical, chart, labs and discussed the procedure including the risks, benefits and alternatives for the proposed anesthesia with the patient or authorized representative who has indicated his/her understanding and acceptance.     Dental advisory given  Plan Discussed with: CRNA  Anesthesia Plan Comments: (PAT note written 07/01/2022 by Myra Gianotti, PA-C.  )       Anesthesia Quick Evaluation

## 2022-07-02 ENCOUNTER — Telehealth: Payer: Self-pay

## 2022-07-02 ENCOUNTER — Telehealth: Payer: Self-pay | Admitting: Family Medicine

## 2022-07-02 NOTE — Telephone Encounter (Signed)
Pt called to ask if he should hold or continue baby ASA prior to surgery. Pt was instructed to remain on this. He verbalized understanding and had no further questions/concerns.

## 2022-07-02 NOTE — Telephone Encounter (Signed)
Contacted Jarome Matin to schedule their annual wellness visit. Appointment made for 07/22/22.  [employer] [facility] direct phone # [phone]

## 2022-07-03 ENCOUNTER — Inpatient Hospital Stay (HOSPITAL_COMMUNITY): Payer: Medicare Other | Admitting: Certified Registered Nurse Anesthetist

## 2022-07-03 ENCOUNTER — Other Ambulatory Visit: Payer: Self-pay

## 2022-07-03 ENCOUNTER — Encounter (HOSPITAL_COMMUNITY)
Admission: RE | Disposition: A | Discharge status: Home / Self Care | Payer: Self-pay | Source: Home / Self Care | Attending: Vascular Surgery

## 2022-07-03 ENCOUNTER — Inpatient Hospital Stay (HOSPITAL_COMMUNITY): Payer: Medicare Other

## 2022-07-03 ENCOUNTER — Inpatient Hospital Stay (HOSPITAL_COMMUNITY): Payer: Medicare Other | Admitting: Vascular Surgery

## 2022-07-03 ENCOUNTER — Encounter (HOSPITAL_COMMUNITY): Payer: Self-pay | Admitting: Vascular Surgery

## 2022-07-03 ENCOUNTER — Inpatient Hospital Stay (HOSPITAL_COMMUNITY)
Admission: RE | Admit: 2022-07-03 | Discharge: 2022-07-05 | DRG: 269 | Disposition: A | Discharge status: Home / Self Care | Payer: Medicare Other | Attending: Vascular Surgery | Admitting: Vascular Surgery

## 2022-07-03 DIAGNOSIS — Z86010 Personal history of colonic polyps: Secondary | ICD-10-CM | POA: Diagnosis not present

## 2022-07-03 DIAGNOSIS — Z8249 Family history of ischemic heart disease and other diseases of the circulatory system: Secondary | ICD-10-CM

## 2022-07-03 DIAGNOSIS — I739 Peripheral vascular disease, unspecified: Secondary | ICD-10-CM

## 2022-07-03 DIAGNOSIS — I10 Essential (primary) hypertension: Secondary | ICD-10-CM | POA: Diagnosis not present

## 2022-07-03 DIAGNOSIS — I714 Abdominal aortic aneurysm, without rupture, unspecified: Principal | ICD-10-CM | POA: Diagnosis present

## 2022-07-03 DIAGNOSIS — Z83719 Family history of colon polyps, unspecified: Secondary | ICD-10-CM | POA: Diagnosis not present

## 2022-07-03 DIAGNOSIS — I7409 Other arterial embolism and thrombosis of abdominal aorta: Secondary | ICD-10-CM

## 2022-07-03 DIAGNOSIS — I358 Other nonrheumatic aortic valve disorders: Secondary | ICD-10-CM | POA: Diagnosis present

## 2022-07-03 DIAGNOSIS — R252 Cramp and spasm: Secondary | ICD-10-CM | POA: Diagnosis not present

## 2022-07-03 DIAGNOSIS — M109 Gout, unspecified: Secondary | ICD-10-CM | POA: Diagnosis present

## 2022-07-03 DIAGNOSIS — D62 Acute posthemorrhagic anemia: Secondary | ICD-10-CM | POA: Diagnosis not present

## 2022-07-03 DIAGNOSIS — Z79899 Other long term (current) drug therapy: Secondary | ICD-10-CM | POA: Diagnosis not present

## 2022-07-03 DIAGNOSIS — I70223 Atherosclerosis of native arteries of extremities with rest pain, bilateral legs: Secondary | ICD-10-CM | POA: Diagnosis present

## 2022-07-03 DIAGNOSIS — I745 Embolism and thrombosis of iliac artery: Secondary | ICD-10-CM | POA: Diagnosis present

## 2022-07-03 DIAGNOSIS — Z7982 Long term (current) use of aspirin: Secondary | ICD-10-CM

## 2022-07-03 DIAGNOSIS — Z8679 Personal history of other diseases of the circulatory system: Principal | ICD-10-CM

## 2022-07-03 HISTORY — PX: PATCH ANGIOPLASTY: SHX6230

## 2022-07-03 HISTORY — PX: THROMBECTOMY FEMORAL ARTERY: SHX6406

## 2022-07-03 HISTORY — PX: INSERTION OF ILIAC STENT: SHX6256

## 2022-07-03 HISTORY — PX: ULTRASOUND GUIDANCE FOR VASCULAR ACCESS: SHX6516

## 2022-07-03 HISTORY — PX: ABDOMINAL AORTIC ENDOVASCULAR STENT GRAFT: SHX5707

## 2022-07-03 LAB — PROTIME-INR
INR: 1.3 — ABNORMAL HIGH (ref 0.8–1.2)
Prothrombin Time: 15.7 seconds — ABNORMAL HIGH (ref 11.4–15.2)

## 2022-07-03 LAB — CBC
HCT: 30.4 % — ABNORMAL LOW (ref 39.0–52.0)
Hemoglobin: 10.3 g/dL — ABNORMAL LOW (ref 13.0–17.0)
MCH: 31 pg (ref 26.0–34.0)
MCHC: 33.9 g/dL (ref 30.0–36.0)
MCV: 91.6 fL (ref 80.0–100.0)
Platelets: 124 10*3/uL — ABNORMAL LOW (ref 150–400)
RBC: 3.32 MIL/uL — ABNORMAL LOW (ref 4.22–5.81)
RDW: 12.8 % (ref 11.5–15.5)
WBC: 10.6 10*3/uL — ABNORMAL HIGH (ref 4.0–10.5)
nRBC: 0 % (ref 0.0–0.2)

## 2022-07-03 LAB — BASIC METABOLIC PANEL
Anion gap: 9 (ref 5–15)
BUN: 11 mg/dL (ref 8–23)
CO2: 22 mmol/L (ref 22–32)
Calcium: 7.9 mg/dL — ABNORMAL LOW (ref 8.9–10.3)
Chloride: 107 mmol/L (ref 98–111)
Creatinine, Ser: 0.8 mg/dL (ref 0.61–1.24)
GFR, Estimated: >60 mL/min (ref >60)
Glucose, Bld: 149 mg/dL — ABNORMAL HIGH (ref 70–99)
Potassium: 3.7 mmol/L (ref 3.5–5.1)
Sodium: 138 mmol/L (ref 135–145)

## 2022-07-03 LAB — CREATININE, SERUM
Creatinine, Ser: 0.73 mg/dL (ref 0.61–1.24)
GFR, Estimated: >60 mL/min (ref >60)

## 2022-07-03 LAB — APTT: aPTT: 31 seconds (ref 24–36)

## 2022-07-03 LAB — POCT ACTIVATED CLOTTING TIME
Activated Clotting Time: 147 seconds
Activated Clotting Time: 217 seconds
Activated Clotting Time: 255 seconds

## 2022-07-03 LAB — MAGNESIUM: Magnesium: 1.9 mg/dL (ref 1.7–2.4)

## 2022-07-03 LAB — ABO/RH: ABO/RH(D): O POS

## 2022-07-03 SURGERY — INSERTION, ENDOVASCULAR STENT GRAFT, AORTA, ABDOMINAL
Anesthesia: General | Site: Groin | Laterality: Right

## 2022-07-03 MED ORDER — HEPARIN SODIUM (PORCINE) 1000 UNIT/ML IJ SOLN
INTRAMUSCULAR | Status: DC | PRN
  Administered 2022-07-03 (×2): 3000 [IU] via INTRAVENOUS
  Administered 2022-07-03 (×3): 5000 [IU] via INTRAVENOUS

## 2022-07-03 MED ORDER — PHENYLEPHRINE HCL-NACL 20-0.9 MG/250ML-% IV SOLN
INTRAVENOUS | Status: DC | PRN
  Administered 2022-07-03: 25 ug/min via INTRAVENOUS

## 2022-07-03 MED ORDER — PROTAMINE SULFATE 10 MG/ML IV SOLN
INTRAVENOUS | Status: DC | PRN
  Administered 2022-07-03: 10 mg via INTRAVENOUS
  Administered 2022-07-03: 40 mg via INTRAVENOUS

## 2022-07-03 MED ORDER — SODIUM CHLORIDE 0.9 % IV SOLN: INTRAVENOUS | Status: DC

## 2022-07-03 MED ORDER — PROPOFOL 10 MG/ML IV BOLUS
INTRAVENOUS | Status: AC
  Filled 2022-07-03: qty 20

## 2022-07-03 MED ORDER — PANTOPRAZOLE SODIUM 40 MG PO TBEC
40.0000 mg | DELAYED_RELEASE_TABLET | Freq: Every day | ORAL | Status: DC
  Administered 2022-07-03 – 2022-07-05 (×3): 40 mg via ORAL
  Filled 2022-07-03 (×3): qty 1

## 2022-07-03 MED ORDER — LIDOCAINE 2% (20 MG/ML) 5 ML SYRINGE
INTRAMUSCULAR | Status: AC
  Filled 2022-07-03: qty 5

## 2022-07-03 MED ORDER — FENTANYL CITRATE (PF) 250 MCG/5ML IJ SOLN
INTRAMUSCULAR | Status: AC
  Filled 2022-07-03: qty 5

## 2022-07-03 MED ORDER — CEFAZOLIN SODIUM-DEXTROSE 2-4 GM/100ML-% IV SOLN
2.0000 g | INTRAVENOUS | Status: AC
  Administered 2022-07-03: 2 g via INTRAVENOUS
  Filled 2022-07-03: qty 100

## 2022-07-03 MED ORDER — ALUM & MAG HYDROXIDE-SIMETH 200-200-20 MG/5ML PO SUSP: 15.0000 mL | ORAL | Status: DC | PRN

## 2022-07-03 MED ORDER — ALBUMIN HUMAN 5 % IV SOLN: INTRAVENOUS | Status: DC | PRN

## 2022-07-03 MED ORDER — POTASSIUM CHLORIDE CRYS ER 20 MEQ PO TBCR: 20.0000 meq | EXTENDED_RELEASE_TABLET | Freq: Every day | ORAL | Status: DC | PRN

## 2022-07-03 MED ORDER — LACTATED RINGERS IV SOLN: INTRAVENOUS | Status: DC

## 2022-07-03 MED ORDER — ONDANSETRON HCL 4 MG/2ML IJ SOLN
INTRAMUSCULAR | Status: DC | PRN
  Administered 2022-07-03: 4 mg via INTRAVENOUS

## 2022-07-03 MED ORDER — 0.9 % SODIUM CHLORIDE (POUR BTL) OPTIME
TOPICAL | Status: DC | PRN
  Administered 2022-07-03: 1000 mL

## 2022-07-03 MED ORDER — HEPARIN 6000 UNIT IRRIGATION SOLUTION
Status: AC
  Filled 2022-07-03: qty 500

## 2022-07-03 MED ORDER — HEPARIN SODIUM (PORCINE) 1000 UNIT/ML IJ SOLN
INTRAMUSCULAR | Status: AC
  Filled 2022-07-03: qty 10

## 2022-07-03 MED ORDER — ROCURONIUM BROMIDE 10 MG/ML (PF) SYRINGE
PREFILLED_SYRINGE | INTRAVENOUS | Status: AC
  Filled 2022-07-03: qty 10

## 2022-07-03 MED ORDER — CHLORHEXIDINE GLUCONATE CLOTH 2 % EX PADS: 6.0000 | MEDICATED_PAD | Freq: Once | CUTANEOUS | Status: DC

## 2022-07-03 MED ORDER — FINASTERIDE 5 MG PO TABS
5.0000 mg | ORAL_TABLET | Freq: Every day | ORAL | Status: DC
  Administered 2022-07-04 – 2022-07-05 (×2): 5 mg via ORAL
  Filled 2022-07-03 (×2): qty 1

## 2022-07-03 MED ORDER — OXYCODONE-ACETAMINOPHEN 5-325 MG PO TABS
1.0000 | ORAL_TABLET | ORAL | Status: DC | PRN
  Administered 2022-07-04: 2 via ORAL
  Filled 2022-07-03: qty 2

## 2022-07-03 MED ORDER — ACETAMINOPHEN 325 MG PO TABS
325.0000 mg | ORAL_TABLET | ORAL | Status: DC | PRN
  Administered 2022-07-04: 650 mg via ORAL
  Filled 2022-07-03: qty 2

## 2022-07-03 MED ORDER — HEPARIN SODIUM (PORCINE) 5000 UNIT/ML IJ SOLN: 5000.0000 [IU] | Freq: Three times a day (TID) | INTRAMUSCULAR | Status: DC

## 2022-07-03 MED ORDER — CEFAZOLIN SODIUM 1 G IJ SOLR
INTRAMUSCULAR | Status: AC
  Filled 2022-07-03: qty 20

## 2022-07-03 MED ORDER — NEOSTIGMINE METHYLSULFATE 3 MG/3ML IV SOSY
PREFILLED_SYRINGE | INTRAVENOUS | Status: AC
  Filled 2022-07-03: qty 3

## 2022-07-03 MED ORDER — GUAIFENESIN-DM 100-10 MG/5ML PO SYRP: 15.0000 mL | ORAL_SOLUTION | ORAL | Status: DC | PRN

## 2022-07-03 MED ORDER — PROPOFOL 10 MG/ML IV BOLUS
INTRAVENOUS | Status: DC | PRN
  Administered 2022-07-03 (×2): 10 mg via INTRAVENOUS
  Administered 2022-07-03: 100 mg via INTRAVENOUS
  Administered 2022-07-03: 10 mg via INTRAVENOUS
  Administered 2022-07-03: 20 mg via INTRAVENOUS

## 2022-07-03 MED ORDER — VASOPRESSIN 20 UNIT/ML IV SOLN
INTRAVENOUS | Status: DC | PRN
  Administered 2022-07-03 (×2): 1 [IU] via INTRAVENOUS
  Administered 2022-07-03: .5 [IU] via INTRAVENOUS
  Administered 2022-07-03 (×2): 1 [IU] via INTRAVENOUS
  Administered 2022-07-03 (×2): .5 [IU] via INTRAVENOUS

## 2022-07-03 MED ORDER — GLYCOPYRROLATE PF 0.2 MG/ML IJ SOSY
PREFILLED_SYRINGE | INTRAMUSCULAR | Status: DC | PRN
  Administered 2022-07-03: .4 mg via INTRAVENOUS

## 2022-07-03 MED ORDER — DOXAZOSIN MESYLATE 2 MG PO TABS
4.0000 mg | ORAL_TABLET | Freq: Every day | ORAL | Status: DC
  Administered 2022-07-04 – 2022-07-05 (×2): 4 mg via ORAL
  Filled 2022-07-03 (×2): qty 2

## 2022-07-03 MED ORDER — SODIUM CHLORIDE 0.9 % IV SOLN: 500.0000 mL | Freq: Once | INTRAVENOUS | Status: DC | PRN

## 2022-07-03 MED ORDER — HEMOSTATIC AGENTS (NO CHARGE) OPTIME
TOPICAL | Status: DC | PRN
  Administered 2022-07-03: 1 via TOPICAL

## 2022-07-03 MED ORDER — ONDANSETRON HCL 4 MG/2ML IJ SOLN: 4.0000 mg | Freq: Four times a day (QID) | INTRAMUSCULAR | Status: DC | PRN

## 2022-07-03 MED ORDER — IODIXANOL 320 MG/ML IV SOLN
INTRAVENOUS | Status: DC | PRN
  Administered 2022-07-03: 100 mL via INTRA_ARTERIAL
  Administered 2022-07-03: 150 mL via INTRA_ARTERIAL

## 2022-07-03 MED ORDER — CLOPIDOGREL BISULFATE 75 MG PO TABS
75.0000 mg | ORAL_TABLET | Freq: Every day | ORAL | Status: DC
  Administered 2022-07-04 – 2022-07-05 (×2): 75 mg via ORAL
  Filled 2022-07-03 (×2): qty 1

## 2022-07-03 MED ORDER — DEXAMETHASONE SODIUM PHOSPHATE 10 MG/ML IJ SOLN
INTRAMUSCULAR | Status: DC | PRN
  Administered 2022-07-03: 10 mg via INTRAVENOUS

## 2022-07-03 MED ORDER — MAGNESIUM SULFATE 2 GM/50ML IV SOLN: 2.0000 g | Freq: Every day | INTRAVENOUS | Status: DC | PRN

## 2022-07-03 MED ORDER — CEFAZOLIN SODIUM-DEXTROSE 2-4 GM/100ML-% IV SOLN
2.0000 g | Freq: Three times a day (TID) | INTRAVENOUS | Status: AC
  Administered 2022-07-03 (×2): 2 g via INTRAVENOUS
  Filled 2022-07-03 (×2): qty 100

## 2022-07-03 MED ORDER — PROTAMINE SULFATE 10 MG/ML IV SOLN
INTRAVENOUS | Status: AC
  Filled 2022-07-03: qty 5

## 2022-07-03 MED ORDER — ASPIRIN 81 MG PO TBEC
81.0000 mg | DELAYED_RELEASE_TABLET | Freq: Every day | ORAL | Status: DC
  Administered 2022-07-04 – 2022-07-05 (×2): 81 mg via ORAL
  Filled 2022-07-03 (×2): qty 1

## 2022-07-03 MED ORDER — CHLORHEXIDINE GLUCONATE 0.12 % MT SOLN
15.0000 mL | Freq: Once | OROMUCOSAL | Status: AC
  Administered 2022-07-03: 15 mL via OROMUCOSAL
  Filled 2022-07-03: qty 15

## 2022-07-03 MED ORDER — CEFAZOLIN SODIUM-DEXTROSE 2-3 GM-%(50ML) IV SOLR
INTRAVENOUS | Status: DC | PRN
  Administered 2022-07-03: 2 g via INTRAVENOUS

## 2022-07-03 MED ORDER — PHENOL 1.4 % MT LIQD: 1.0000 | OROMUCOSAL | Status: DC | PRN

## 2022-07-03 MED ORDER — LACTATED RINGERS IV SOLN: INTRAVENOUS | Status: DC | PRN

## 2022-07-03 MED ORDER — DOCUSATE SODIUM 100 MG PO CAPS
100.0000 mg | ORAL_CAPSULE | Freq: Every day | ORAL | Status: DC
  Administered 2022-07-04: 100 mg via ORAL
  Filled 2022-07-03 (×2): qty 1

## 2022-07-03 MED ORDER — ORAL CARE MOUTH RINSE: 15.0000 mL | Freq: Once | OROMUCOSAL | Status: AC

## 2022-07-03 MED ORDER — PHENYLEPHRINE 80 MCG/ML (10ML) SYRINGE FOR IV PUSH (FOR BLOOD PRESSURE SUPPORT)
PREFILLED_SYRINGE | INTRAVENOUS | Status: AC
  Filled 2022-07-03: qty 10

## 2022-07-03 MED ORDER — FENTANYL CITRATE (PF) 250 MCG/5ML IJ SOLN
INTRAMUSCULAR | Status: DC | PRN
  Administered 2022-07-03 (×2): 50 ug via INTRAVENOUS
  Administered 2022-07-03: 25 ug via INTRAVENOUS
  Administered 2022-07-03: 50 ug via INTRAVENOUS
  Administered 2022-07-03: 25 ug via INTRAVENOUS
  Administered 2022-07-03: 50 ug via INTRAVENOUS

## 2022-07-03 MED ORDER — POLYETHYLENE GLYCOL 3350 17 G PO PACK: 17.0000 g | PACK | Freq: Every day | ORAL | Status: DC | PRN

## 2022-07-03 MED ORDER — ONDANSETRON HCL 4 MG/2ML IJ SOLN
INTRAMUSCULAR | Status: AC
  Filled 2022-07-03: qty 2

## 2022-07-03 MED ORDER — GLYCOPYRROLATE PF 0.2 MG/ML IJ SOSY
PREFILLED_SYRINGE | INTRAMUSCULAR | Status: AC
  Filled 2022-07-03: qty 2

## 2022-07-03 MED ORDER — NEOSTIGMINE METHYLSULFATE 3 MG/3ML IV SOSY
PREFILLED_SYRINGE | INTRAVENOUS | Status: DC | PRN
  Administered 2022-07-03: 3 mg via INTRAVENOUS

## 2022-07-03 MED ORDER — LIDOCAINE 2% (20 MG/ML) 5 ML SYRINGE
INTRAMUSCULAR | Status: DC | PRN
  Administered 2022-07-03: 40 mg via INTRAVENOUS
  Administered 2022-07-03: 20 mg via INTRAVENOUS

## 2022-07-03 MED ORDER — EPHEDRINE 5 MG/ML INJ
INTRAVENOUS | Status: AC
  Filled 2022-07-03: qty 5

## 2022-07-03 MED ORDER — BOOST PO LIQD
237.0000 mL | Freq: Every day | ORAL | Status: DC
  Administered 2022-07-03 – 2022-07-05 (×3): 237 mL via ORAL
  Filled 2022-07-03 (×3): qty 237

## 2022-07-03 MED ORDER — DEXAMETHASONE SODIUM PHOSPHATE 10 MG/ML IJ SOLN
INTRAMUSCULAR | Status: AC
  Filled 2022-07-03: qty 1

## 2022-07-03 MED ORDER — ACETAMINOPHEN 650 MG RE SUPP: 325.0000 mg | RECTAL | Status: DC | PRN

## 2022-07-03 MED ORDER — HYDRALAZINE HCL 20 MG/ML IJ SOLN: 5.0000 mg | INTRAMUSCULAR | Status: DC | PRN

## 2022-07-03 MED ORDER — PHENYLEPHRINE 80 MCG/ML (10ML) SYRINGE FOR IV PUSH (FOR BLOOD PRESSURE SUPPORT)
PREFILLED_SYRINGE | INTRAVENOUS | Status: DC | PRN
  Administered 2022-07-03: 80 ug via INTRAVENOUS
  Administered 2022-07-03: 40 ug via INTRAVENOUS

## 2022-07-03 MED ORDER — ROCURONIUM BROMIDE 10 MG/ML (PF) SYRINGE
PREFILLED_SYRINGE | INTRAVENOUS | Status: DC | PRN
  Administered 2022-07-03: 10 mg via INTRAVENOUS
  Administered 2022-07-03: 60 mg via INTRAVENOUS

## 2022-07-03 MED ORDER — ROSUVASTATIN CALCIUM 5 MG PO TABS
10.0000 mg | ORAL_TABLET | Freq: Every day | ORAL | Status: DC
  Administered 2022-07-04 – 2022-07-05 (×2): 10 mg via ORAL
  Filled 2022-07-03 (×2): qty 2

## 2022-07-03 MED ORDER — BISACODYL 10 MG RE SUPP: 10.0000 mg | Freq: Every day | RECTAL | Status: DC | PRN

## 2022-07-03 MED ORDER — HEPARIN 6000 UNIT IRRIGATION SOLUTION
Status: DC | PRN
  Administered 2022-07-03 (×2): 1

## 2022-07-03 MED ORDER — LABETALOL HCL 5 MG/ML IV SOLN: 10.0000 mg | INTRAVENOUS | Status: DC | PRN

## 2022-07-03 MED ORDER — MORPHINE SULFATE (PF) 2 MG/ML IV SOLN: 2.0000 mg | INTRAVENOUS | Status: DC | PRN

## 2022-07-03 MED ORDER — METOPROLOL TARTRATE 5 MG/5ML IV SOLN: 2.0000 mg | INTRAVENOUS | Status: DC | PRN

## 2022-07-03 MED ORDER — EPHEDRINE SULFATE-NACL 50-0.9 MG/10ML-% IV SOSY
PREFILLED_SYRINGE | INTRAVENOUS | Status: DC | PRN
  Administered 2022-07-03 (×4): 5 mg via INTRAVENOUS

## 2022-07-03 MED ORDER — VASOPRESSIN 20 UNIT/ML IV SOLN
INTRAVENOUS | Status: AC
  Filled 2022-07-03: qty 1

## 2022-07-03 SURGICAL SUPPLY — 82 items
ADH SKN CLS APL DERMABOND .7 (GAUZE/BANDAGES/DRESSINGS) ×3
ADH SKN CLS LQ APL DERMABOND (GAUZE/BANDAGES/DRESSINGS) ×6
BAG COUNTER SPONGE SURGICOUNT (BAG) ×3 IMPLANT
BAG SPNG CNTER NS LX DISP (BAG) ×3
BALLN MUSTANG 7X60X75 (BALLOONS) ×3
BALLN MUSTANG 8.0X40 75 (BALLOONS) ×3
BALLOON MUSTANG 7X60X75 (BALLOONS) IMPLANT
BALLOON MUSTANG 8.0X40 75 (BALLOONS) IMPLANT
BLADE CLIPPER SURG (BLADE) ×3 IMPLANT
CANISTER SUCT 3000ML PPV (MISCELLANEOUS) ×3 IMPLANT
CATH BEACON 5.038 65CM KMP-01 (CATHETERS) ×3 IMPLANT
CATH EMB 3FR 80 (CATHETERS) IMPLANT
CATH OMNI FLUSH .035X70CM (CATHETERS) ×3 IMPLANT
DERMABOND ADVANCED .7 DNX12 (GAUZE/BANDAGES/DRESSINGS) ×3 IMPLANT
DERMABOND ADVANCED .7 DNX6 (GAUZE/BANDAGES/DRESSINGS) IMPLANT
DEVICE CLOSURE PERCLS PRGLD 6F (VASCULAR PRODUCTS) ×12 IMPLANT
ELECT REM PT RETURN 9FT ADLT (ELECTROSURGICAL) ×3
ELECTRODE REM PT RTRN 9FT ADLT (ELECTROSURGICAL) ×6 IMPLANT
EXCLDR TRNK ENDO 26X14.5X12 16 (Endovascular Graft) ×3 IMPLANT
EXCLUDER TNK END 26X14.5X12 16 (Endovascular Graft) IMPLANT
GLIDEWIRE ADV .035X180CM (WIRE) ×3 IMPLANT
GLOVE BIO SURGEON STRL SZ7.5 (GLOVE) ×3 IMPLANT
GOWN STRL REUS W/ TWL LRG LVL3 (GOWN DISPOSABLE) ×6 IMPLANT
GOWN STRL REUS W/ TWL XL LVL3 (GOWN DISPOSABLE) ×6 IMPLANT
GOWN STRL REUS W/TWL LRG LVL3 (GOWN DISPOSABLE) ×6
GOWN STRL REUS W/TWL XL LVL3 (GOWN DISPOSABLE) ×6
GRAFT BALLN CATH 65CM (STENTS) ×3 IMPLANT
KIT BASIN OR (CUSTOM PROCEDURE TRAY) ×3 IMPLANT
KIT ENCORE 26 ADVANTAGE (KITS) IMPLANT
KIT TURNOVER KIT B (KITS) ×3 IMPLANT
LEG CONTRALATERAL 16X16X9.5 (Endovascular Graft) IMPLANT
LEG CONTRALATERAL 16X18X13.5 (Endovascular Graft) IMPLANT
LEG CONTRALATERAL 27X14 (Vascular Products) IMPLANT
LOOP VASCLR MAXI BLUE 18IN ST (MISCELLANEOUS) IMPLANT
LOOP VASCULAR MAXI 18 BLUE (MISCELLANEOUS) ×6
LOOP VASCULAR MINI 18 RED (MISCELLANEOUS) ×6
LOOPS VASCLR MAXI BLUE 18IN ST (MISCELLANEOUS) ×6 IMPLANT
NS IRRIG 1000ML POUR BTL (IV SOLUTION) ×3 IMPLANT
PACK ENDOVASCULAR (PACKS) ×3 IMPLANT
PAD ARMBOARD 7.5X6 YLW CONV (MISCELLANEOUS) ×6 IMPLANT
PATCH VASC XENOSURE 1CMX6CM (Vascular Products) ×3 IMPLANT
PATCH VASC XENOSURE 1X6 (Vascular Products) IMPLANT
PENCIL BUTTON HOLSTER BLD 10FT (ELECTRODE) IMPLANT
PERCLOSE PROGLIDE 6F (VASCULAR PRODUCTS) ×27
POWDER SURGICEL 3.0 GRAM (HEMOSTASIS) IMPLANT
SET MICROPUNCTURE 5F STIFF (MISCELLANEOUS) ×3 IMPLANT
SHEATH BRITE TIP 8FR 23CM (SHEATH) ×3 IMPLANT
SHEATH DRYSEAL FLEX 12FR 33CM (SHEATH) IMPLANT
SHEATH DRYSEAL FLEX 16FR 33CM (SHEATH) IMPLANT
SHEATH PINNACLE 8F 10CM (SHEATH) ×3 IMPLANT
SPONGE T-LAP 18X18 ~~LOC~~+RFID (SPONGE) ×6 IMPLANT
STENT ELUVIA 7X60X130 (Permanent Stent) IMPLANT
STENT ELUVIA 7X80X130 (Permanent Stent) IMPLANT
STENT GRAFT BALLN CATH 65CM (STENTS) ×3
STENT INNOVA 8X40X130 (Permanent Stent) IMPLANT
STENT VIABAHN VBX 7X59X135 (Permanent Stent) IMPLANT
STENT VIABAHNBX 7X39X136 (Permanent Stent) IMPLANT
STOPCOCK 4 WAY LG BORE MALE ST (IV SETS) IMPLANT
STOPCOCK MORSE 400PSI 3WAY (MISCELLANEOUS) ×3 IMPLANT
SUT MNCRL AB 4-0 PS2 18 (SUTURE) ×6 IMPLANT
SUT PROLENE 5 0 C 1 24 (SUTURE) IMPLANT
SUT PROLENE 6 0 BV (SUTURE) IMPLANT
SUT SILK 2 0 (SUTURE) ×3
SUT SILK 2-0 18XBRD TIE 12 (SUTURE) IMPLANT
SUT SILK 3 0 (SUTURE) ×3
SUT SILK 3-0 18XBRD TIE 12 (SUTURE) IMPLANT
SUT SILK 4 0 (SUTURE) ×3
SUT SILK 4-0 18XBRD TIE 12 (SUTURE) IMPLANT
SUT VIC AB 2-0 CT1 27 (SUTURE) ×3
SUT VIC AB 2-0 CT1 TAPERPNT 27 (SUTURE) IMPLANT
SUT VIC AB 3-0 SH 27 (SUTURE) ×3
SUT VIC AB 3-0 SH 27X BRD (SUTURE) IMPLANT
SYR 20ML LL LF (SYRINGE) ×3 IMPLANT
SYR 3ML LL SCALE MARK (SYRINGE) IMPLANT
TOWEL GREEN STERILE (TOWEL DISPOSABLE) ×3 IMPLANT
TRAY FOLEY MTR SLVR 16FR STAT (SET/KITS/TRAYS/PACK) ×3 IMPLANT
TUBING INJECTOR 48 (MISCELLANEOUS) ×3 IMPLANT
VASCULAR TIE MAXI BLUE 18IN ST (MISCELLANEOUS) ×6
VASCULAR TIE MINI RED 18IN STL (MISCELLANEOUS) IMPLANT
WIRE AMPLATZ SS-J .035X180CM (WIRE) ×3 IMPLANT
WIRE BENTSON .035X145CM (WIRE) ×6 IMPLANT
WIRE ROSEN-J .035X260CM (WIRE) IMPLANT

## 2022-07-03 NOTE — Progress Notes (Signed)
  Day of Surgery Note    Subjective:  resting in recovery.  Says right foot feels normal   Vitals:   07/03/22 1130 07/03/22 1145  BP: (!) 94/54 (!) 105/56  Pulse: 70 65  Resp: 16 14  Temp: 97.6 F (36.4 C)   SpO2: 95% 95%    Incisions:   right groin incision is clean and dry and the left groin is also soft without hematoma Extremities:  brisk right AT and left PT doppler signals; right calf is soft and non tender.  Bilateral feet with motor and sensory in tact Cardiac:  regular Lungs:  non labored Abdomen:  soft   Assessment/Plan:  This is a 82 y.o. male who is s/p  aortobiiliac stent graft,  Stent of right external iliac artery,  Stent of left external iliac artery, Open exposure right common femoral artery with femoral endarterectomy and bovine pericardial patch angioplasty and right lower extremity thrombectomy   -pt with brisk right AT and left PT doppler flow.  Right calf is soft and non tender.  Bilateral feet with motor and sensory in tact -right groin incision and left groin look fine without hematoma. -start plavix 88m daily today for iliac stents -to [place] today.      SLeontine Locket PA-C 07/03/2022 12:03 PM [phone]

## 2022-07-03 NOTE — Plan of Care (Signed)
  Problem: Education: Goal: Knowledge of General Education information will improve Description: Including pain rating scale, medication(s)/side effects and non-pharmacologic comfort measures Outcome: Progressing   Problem: Clinical Measurements: Goal: Respiratory complications will improve Outcome: Progressing   Problem: Activity: Goal: Risk for activity intolerance will decrease Outcome: Progressing   

## 2022-07-03 NOTE — Transfer of Care (Signed)
Immediate Anesthesia Transfer of Care Note  Patient: Jacob Bowen  Procedure(s) Performed: ABDOMINAL AORTIC ENDOVASCULAR STENT GRAFT INSERTION OF ILIAC STENTS (Bilateral: Groin) ULTRASOUND GUIDANCE FOR VASCULAR ACCESS (Bilateral: Groin) PATCH ANGIOPLASTY RIGHT COMMON FEMORAL ARTERY (Right: Groin) THROMBECTOMY FEMORAL ARTERY (Right: Groin)  Patient Location: PACU  Anesthesia Type:General  Level of Consciousness: patient cooperative and responds to stimulation  Airway & Oxygen Therapy: Patient Spontanous Breathing and Patient connected to nasal cannula oxygen  Post-op Assessment: Report given to RN, Post -op Vital signs reviewed and stable, and Patient moving all extremities X 4  Post vital signs: Reviewed and stable  Last Vitals:  Vitals Value Taken Time  BP 94/54 07/03/22 1132  Temp    Pulse 61 07/03/22 1137  Resp 16 07/03/22 1137  SpO2 94 % 07/03/22 1137  Vitals shown include unvalidated device data.  Last Pain:  Vitals:   07/03/22 0619  TempSrc:   PainSc: 0-No pain         Complications: No notable events documented.

## 2022-07-03 NOTE — Op Note (Addendum)
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 Donzetta Matters, MD Assistant: Kateri Plummer, PA Procedure Performed: 1.  Percutaneous access and closure bilateral common femoral arteries 2.  Placement of aortobiiliac stent graft main body left 26 x 14 x 12 extending with 27 x 14 cm bell bottom via 16 French sheath and contralateral limbs on the right 16 x 9-1/2 extender with 18 x 13.5 cm via 12 French sheath 3.  Stent of right external iliac artery with proximal 7 x 80 mm Eluvia extender with 7 x 60 mm Eluvia 4.  Stent of left external iliac artery with 8 x 40 Innova followed by 7 x 59 mm VBX and 7 x 39 mm VBX 5.  Open exposure right common femoral artery with femoral endarterectomy and bovine pericardial patch angioplasty 6.  Right lower extremity thrombectomy with 3 Fogarty   Indications: 82 year old male with a history of abdominal aortic aneurysm now measuring 5.5 cm and indicated for repair.  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  He does not have palpable common femoral artery pulses will I have reviewed his CT scan and he appears to be a candidate for endovascular repair with stenting of the external iliac artery    Findings: On initial angiography there is approximately 90% stenosis of the right external iliac artery and this was stented with a proximal 7 x 80 mm Eluvia and distal 7 x 60 mm Eluvia and ballooned with 7 mm balloon at completion.  We did attempt percutaneous closure with our percutaneous access but ultimately this failed and required right common femoral cutdown at the end of the case.  The case was performed with a 12 French sheath as the contralateral limb through the right.  I also initially placed an 8 mm  Innova stent in the left external iliac artery but unfortunately percutaneous access of the left common femoral artery the stent was damaged and I then excluded this with 7 x 59 mm distal VBX and 7 x 39 mm proximal VBX up to the level of the hypogastric artery on the left and these were ballooned with 8 mm balloon at completion.  The left groin was percutaneously accessed and closed in a completion there was a very strong posterior tibial signal at the foot.  After placing the main body through the left to the level of the left renal we were able to ambulate this and obtain a good seal of the lowest left renal artery.  The right limb did require an extension device and both limbs were ultimately placed just above the hypogastric arteries with stents just below the hypogastric arteries but the hypogastric arteries were patent bilaterally at completion.  There were no endoleak's at completion of stenting.  After we performed common femoral endarterectomy and bovine pericardial patch angioplasty on the right there was a strong anterior tibial signal and a monophasic posterior tibial signal in the right ankle and the foot appears well-perfused.   Procedure:  The patient was identified in the holding area and taken to the operating room where is placed supine operative when general anesthesia was induced.  He was sterilely prepped and draped in the abdomen in the bilateral groins in usual fashion, antibiotics were administered and a timeout was called.  We began  using ultrasound to percutaneously accessed the right common artery with a micropuncture needle followed by wire and a sheath.  I then placed a Bentson wire followed by an 8 French sheath and performed retrograde angiography which demonstrated high-grade stenosis of the right external iliac artery.  With this we administered 3000 heparin and ultimately an additional 3000 units of heparin and then I primarily stented the right external iliac artery with a 7 x 80  mm central and distal 7 x 60 mm Eluvia and they were postdilated with 7 mm balloons.  I then placed a long 8 French sheath up into the common iliac artery on the left.  Then went up and over the bifurcation performed distal angiography which demonstrated the subtotally occluded left external iliac artery.  This was then crossed with a Glidewire advantage and intraluminal access was confirmed.  This was then primarily stented with an 8 x 40 mm Innova and postdilated with a 8 mm balloon.  We then proceeded with percutaneous closure of the right common femoral access site over a Bentson wire placing 2 Pro-glide devices.  Unfortunately the first 2 did fail and I believe this was due to the stent.  I placed 2 more and then placed a 12 French sheath and there was hemostasis obtained.  We then turned our attention to the left common femoral artery which we used ultrasound and percutaneous access left common femoral artery where it appeared healthy with micropuncture needle followed by wire and the sheath.  I then placed a Bentson wire followed by an 8 [place] sheath but unfortunately this did cause issues with the stent.  With this I elected to then primarily stent with a 7 x 59 mm VBX initially and this was ballooned to approximately 8-1/2 mm diameter.  We then performed percutaneous closure with 2 Pro-glide devices which worked very well using fluoroscopy to not injure the stents at this time.  We then placed a long 8 French sheath into the aorta and an Amplatz wire and then placed a 16 French sheath the patient was given additional heparin at this time for a total of 11,000 units.  We then placed the main body up to the level of L1 and from the right side placed an Omni catheter to the level of L1 performed aortogram.  The main body was then deployed and it was somewhat low so it was recaptured angled and redeployed.  I then cannulated the gate from the right side and confirmed intraluminal access by spinning and Omni  catheter.  We performed another angiogram which demonstrated we will below the lowest left renal artery.  We then placed a bridging device from the right which was 16 x 9.5 cm and then extended down to just above the hypogastric artery with an 18 x 13.5 cm device.  From the left side we then extended down to just above the hypogastric after the device was fully deployed and we did stented with a 27 x 14 cm device.  The entire device was ironed out.  Completion angiography demonstrated patency of the renal arteries there was no endoleak identified.  Both hypogastric arteries were patent.  I then elected to place a second VBX on the left side this was done with a 7 x 39 placed up to the level of the left hypogastric.  We then removed the 16 French sheath over a Bentson wire and deployed our Pro-glide devices and then deployed well and there was hemostasis.  From the right  side I attempted to remove the sheath and deployed the Pro-glide devices but they would not deploy well and there was bleeding.  I attempted to place another provide device in the 12 o'clock position but again no hemostasis was obtained and I replaced the 12 French sheath over a Bentson.  The left foot had a very strong signal and the Pro-glide devices were trimmed down to size.  I then turned my attention to cutting down the right common femoral artery.  We initially were able to dissect this out very quickly proximally and distally over the 12 French sheath and 12 French sheath and wire were removed and the artery was clamped proximally and distally.  We then cleaned up the area around the arteriotomy and attempted to primarily closed with 5-0 Prolene suture from either side.  Unfortunate this was done there was no signal in the foot and it did not appear ischemic.  Additional heparin now was administered a total of 5000 additional units.  I then dissected further down to the profunda and SFA encircled these with Vesseloops under the inguinal  ligament to encircled the extrailiac artery and large branch vessel that was laterally placed with a vessel loop as well.  I then opened the vessel vertically.  There was significant disease.  We performed extensive endarterectomy although I did not go under the inguinal ligament and fear that I would have issues with the stent.  Distally we transected the plaque at the level of the SFA takeoff and we had very strong backbleeding from the profunda.  Initially there was not great backbleeding from the SFA and a 3 Fogarty was passed up to 40 cm and after this was removed there was no clot identified but there was strong backbleeding.  I also passed that up to 15 cm down the profunda and again establish very strong backbleeding.  These vessels were clamped.  I then sewed in the bovine pericardial patch in place as an angioplasty.  Prior completion without flushing in all directions.  Upon completion there was very strong signal in the profunda and the SFA within the wound bed and now a strong signal at the ankle at the anterior tibial artery and a weaker posterior tibial signal.  Satisfied with this we administered protamine.  We obtained meticulous hemostasis in the right groin and irrigated and then closed in layers with Vicryl and Monocryl.  Dermabond was placed at the skin level.  The patient was awakened from anesthesia having tolerated procedure without any complication.  All counts were correct at completion.  An experienced assistant was necessary given the complex nature of this case to pass wires, sheaths, catheters, balloons and stents as well as to assist with deploying the devices and then also assist with exposing the common femoral artery expeditiously and with patch angioplasty and endarterectomy.  EBL: 300 cc  Contrast: 130 cc    Shatona Andujar C. Donzetta Matters, MD [facility] Office: [phone] Pager: [phone]

## 2022-07-03 NOTE — Anesthesia Procedure Notes (Signed)
Arterial Line Insertion Start/End2/29/2024 7:11 AM, 07/03/2022 7:15 AM Performed by: Betha Loa, CRNA, CRNA  Preanesthetic checklist: patient identified, IV checked, site marked, risks and benefits discussed, surgical consent, monitors and equipment checked, pre-op evaluation, timeout performed and anesthesia consent Lidocaine 1% used for infiltration Right, radial was placed Catheter size: 20 G Hand hygiene performed  and maximum sterile barriers used   Attempts: 1 Procedure performed without using ultrasound guided technique. Following insertion, dressing applied and Biopatch. Post procedure assessment: normal  Patient tolerated the procedure well with no immediate complications.

## 2022-07-03 NOTE — Anesthesia Postprocedure Evaluation (Signed)
Anesthesia Post Note  Patient: Jacob Bowen  Procedure(s) Performed: ABDOMINAL AORTIC ENDOVASCULAR STENT GRAFT INSERTION OF ILIAC STENTS (Bilateral: Groin) ULTRASOUND GUIDANCE FOR VASCULAR ACCESS (Bilateral: Groin) PATCH ANGIOPLASTY RIGHT COMMON FEMORAL ARTERY (Right: Groin) THROMBECTOMY FEMORAL ARTERY (Right: Groin)     Patient location during evaluation: PACU Anesthesia Type: General Level of consciousness: awake and alert Pain management: pain level controlled Vital Signs Assessment: post-procedure vital signs reviewed and stable Respiratory status: spontaneous breathing, nonlabored ventilation, respiratory function stable and patient connected to nasal cannula oxygen Cardiovascular status: blood pressure returned to baseline and stable Postop Assessment: no apparent nausea or vomiting Anesthetic complications: no  No notable events documented.  Last Vitals:  Vitals:   07/03/22 1243 07/03/22 1500  BP: (!) 108/53   Pulse: 71 75  Resp: 12 16  Temp: (!) 36.4 C   SpO2: 97% 99%    Last Pain:  Vitals:   07/03/22 1500  TempSrc:   PainSc: 0-No pain                 Effie Berkshire

## 2022-07-03 NOTE — Discharge Instructions (Signed)
  [facility]   Discharge Instructions  Endovascular Aortic Aneurysm Repair  Please refer to the following instructions for your post-procedure care. Your surgeon or Physician Assistant will discuss any changes with you.  Activity  You are encouraged to walk as much as you can. You can slowly return to normal activities but must avoid strenuous activity and heavy lifting until your doctor tells you it's OK. Avoid activities such as vacuuming or swinging a gold club. It is normal to feel tired for several weeks after your surgery. Do not drive until your doctor gives the OK and you are no longer taking prescription pain medications. It is also normal to have difficulty with sleep habits, eating, and bowel movements after surgery. These will go away with time.  Bathing/Showering  Shower daily after you go home.  Do not soak in a bathtub, hot tub, or swim until the incision heals completely.  If you have incisions in your groin, wash the groin wounds with soap and water daily and pat dry. (No tub bath-only shower)  Then put a dry gauze or washcloth there to keep this area dry to help prevent wound infection daily and as needed.  Do not use Vaseline or neosporin on your incisions.  Only use soap and water on your incisions and then protect and keep dry.  Incision Care  Shower every day. Clean your incision with mild soap and water. Pat the area dry with a clean towel. You do not need a bandage unless otherwise instructed. Do not apply any ointments or creams to your incision. If you clothing is irritating, you may cover your incision with a dry gauze pad.  Diet  Resume your normal diet. There are no special food restrictions following this procedure. A low fat/low cholesterol diet is recommended for all patients with vascular disease. In order to heal from your surgery, it is CRITICAL to get adequate nutrition. Your body requires vitamins, minerals, and protein.  Vegetables are the best source of vitamins and minerals. Vegetables also provide the perfect balance of protein. Processed food has little nutritional value, so try to avoid this.  Medications  Resume taking all of your medications unless your doctor or nurse practitioner tells you not to. If your incision is causing pain, you may take over-the-counter pain relievers such as acetaminophen (Tylenol). If you were prescribed a stronger pain medication, please be aware these medications can cause nausea and constipation. Prevent nausea by taking the medication with a snack or meal. Avoid constipation by drinking plenty of fluids and eating foods with a high amount of fiber, such as fruits, vegetables, and grains.  Do not take Tylenol if you are taking prescription pain medications.   Follow up  Our office will schedule a follow-up appointment with a CT scan 3-4 weeks after your surgery.  Please call us immediately for any of the following conditions  Severe or worsening pain in your legs or feet or in your abdomen back or chest. Increased pain, redness, drainage (pus) from your incision site. Increased abdominal pain, bloating, nausea, vomiting or persistent diarrhea. Fever of 101 degrees or higher. Swelling in your leg (s),  Reduce your risk of vascular disease  Stop smoking. If you would like help call [facility] at 1-800-QUIT-NOW (1-800-784-8669) or [place] at [phone]. Manage your cholesterol Maintain a desired weight Control your diabetes Keep your blood pressure down  If you have questions, please call the office at [phone].  

## 2022-07-03 NOTE — Interval H&P Note (Signed)
History and Physical Interval Note:  07/03/2022 7:09 AM  Jacob Bowen  has presented today for surgery, with the diagnosis of Abdominal aortic aneurysm without rupture.  The various methods of treatment have been discussed with the patient and family. After consideration of risks, benefits and other options for treatment, the patient has consented to  Procedure(s): ABDOMINAL AORTIC ENDOVASCULAR STENT GRAFT (N/A) as a surgical intervention.  The patient's history has been reviewed, patient examined, no change in status, stable for surgery.  I have reviewed the patient's chart and labs.  Questions were answered to the patient's satisfaction.     Servando Snare

## 2022-07-03 NOTE — Anesthesia Procedure Notes (Signed)
Procedure Name: Intubation Date/Time: 07/03/2022 7:37 AM  Performed by: Betha Loa, CRNAPre-anesthesia Checklist: Patient identified, Emergency Drugs available, Suction available and Patient being monitored Patient Re-evaluated:Patient Re-evaluated prior to induction Oxygen Delivery Method: Circle System Utilized Preoxygenation: Pre-oxygenation with 100% oxygen Induction Type: IV induction Ventilation: Mask ventilation without difficulty Laryngoscope Size: Mac and 4 Grade View: Grade II Tube type: Oral Tube size: 7.5 mm Number of attempts: 1 Airway Equipment and Method: Stylet and Oral airway Placement Confirmation: ETT inserted through vocal cords under direct vision, positive ETCO2 and breath sounds checked- equal and bilateral Secured at: 22 cm Tube secured with: Tape Dental Injury: Teeth and Oropharynx as per pre-operative assessment

## 2022-07-04 LAB — CBC
HCT: 28.6 % — ABNORMAL LOW (ref 39.0–52.0)
Hemoglobin: 9.8 g/dL — ABNORMAL LOW (ref 13.0–17.0)
MCH: 31.4 pg (ref 26.0–34.0)
MCHC: 34.3 g/dL (ref 30.0–36.0)
MCV: 91.7 fL (ref 80.0–100.0)
Platelets: 137 10*3/uL — ABNORMAL LOW (ref 150–400)
RBC: 3.12 MIL/uL — ABNORMAL LOW (ref 4.22–5.81)
RDW: 13 % (ref 11.5–15.5)
WBC: 10.7 10*3/uL — ABNORMAL HIGH (ref 4.0–10.5)
nRBC: 0 % (ref 0.0–0.2)

## 2022-07-04 LAB — BASIC METABOLIC PANEL
Anion gap: 9 (ref 5–15)
BUN: 12 mg/dL (ref 8–23)
CO2: 22 mmol/L (ref 22–32)
Calcium: 8.3 mg/dL — ABNORMAL LOW (ref 8.9–10.3)
Chloride: 104 mmol/L (ref 98–111)
Creatinine, Ser: 0.87 mg/dL (ref 0.61–1.24)
GFR, Estimated: >60 mL/min (ref >60)
Glucose, Bld: 121 mg/dL — ABNORMAL HIGH (ref 70–99)
Potassium: 4.8 mmol/L (ref 3.5–5.1)
Sodium: 135 mmol/L (ref 135–145)

## 2022-07-04 MED ORDER — HEPARIN SODIUM (PORCINE) 5000 UNIT/ML IJ SOLN
5000.0000 [IU] | Freq: Three times a day (TID) | INTRAMUSCULAR | Status: DC
  Administered 2022-07-04 – 2022-07-05 (×3): 5000 [IU] via SUBCUTANEOUS
  Filled 2022-07-04 (×2): qty 1

## 2022-07-04 NOTE — Progress Notes (Addendum)
Progress Note    07/04/2022 6:43 AM 1 Day Post-Op  Subjective:  says both his thighs were cramping when he had to lay flat.  He says that when he is not hydrated, his thighs cramp.  He states he got some cramping in the toes on the left foot.  He has had this happen before surgery as well.  He does not have any pain in the feet.    Afebrile HR 70's-90's NSR 0000000 systolic 0000000 [id number]  Gtts:  none  Vitals:   07/04/22 0300 07/04/22 0400  BP:    Pulse:    Resp:  16  Temp: 98 F (36.7 C) 98.1 F (36.7 C)  SpO2:      Physical Exam: General:  no distress Cardiac:  regular Lungs:  non labored Incisions:  right groin incision is clean without hematoma; left groin soft without hematoma.   Extremities:  right foot significantly cooler than the left.  + doppler flow right AT/PT/pero and left brisk PT.  Right calf is soft without tenderness.  Motor and sensory are in tact bilateral feet.  Abdomen:  soft  CBC    Component Value Date/Time   WBC 10.7 (H) 07/04/2022 0430   RBC 3.12 (L) 07/04/2022 0430   HGB 9.8 (L) 07/04/2022 0430   HGB 14.2 07/16/2021 1209   HCT 28.6 (L) 07/04/2022 0430   HCT 40.5 07/16/2021 1209   PLT 137 (L) 07/04/2022 0430   PLT 236 07/16/2021 1209   MCV 91.7 07/04/2022 0430   MCV 89 07/16/2021 1209   MCH 31.4 07/04/2022 0430   MCHC 34.3 07/04/2022 0430   RDW 13.0 07/04/2022 0430   RDW 12.1 07/16/2021 1209   LYMPHSABS 1.4 07/16/2021 1209   MONOABS 740 06/06/2016 1038   EOSABS 0.1 07/16/2021 1209   BASOSABS 0.0 07/16/2021 1209    BMET    Component Value Date/Time   NA 135 07/04/2022 0430   NA 143 06/30/2022 1525   K 4.8 07/04/2022 0430   CL 104 07/04/2022 0430   CO2 22 07/04/2022 0430   GLUCOSE 121 (H) 07/04/2022 0430   BUN 12 07/04/2022 0430   BUN 12 06/30/2022 1525   CREATININE 0.87 07/04/2022 0430   CREATININE 0.93 06/06/2016 1038   CALCIUM 8.3 (L) 07/04/2022 0430   GFRNONAA >60 07/04/2022 0430   GFRAA 90 06/28/2019 1000     INR    Component Value Date/Time   INR 1.3 (H) 07/03/2022 1133     Intake/Output Summary (Last 24 hours) at 07/04/2022 [id number] Last data filed at 07/04/2022 0539 Gross per 24 hour  Intake 2550.75 ml  Output 3125 ml  Net -574.25 ml      Assessment/Plan:  82 y.o. male is s/p:  aortobiiliac stent graft,  Stent of right external iliac artery,  Stent of left external iliac artery, Open exposure right common femoral artery with femoral endarterectomy and bovine pericardial patch angioplasty and right lower extremity thrombectomy    1 Day Post-Op   -pt right groin incision is clean without hematoma; left groin soft without hematoma. -right foot is significantly cooler than the left but he has + doppler flow right AT/PT/peroneal.  His motor and sensory are in tact in both feet.   His calf is soft without tenderness -acute surgical blood loss anemia-pt is tolerating.   -pt started on Plavix for iliac stenting. -continue asa/statin/Plavix -DVT prophylaxis:  sq heparin to start this afternoon.    Leontine Locket, PA-C [facility] [phone] 07/04/2022 6:43  AM  I have independently interviewed and examined patient and agree with PA assessment and plan above. R foot is cooler than left but with strong signals and long standing history of RLE claudication. Both feet appear well perfused and he is really without complaints. Plan to mobilize today and possible dc tomorrow.  Zailynn Brandel C. Donzetta Matters, MD [facility] Office: [phone] Pager: [phone]

## 2022-07-04 NOTE — Progress Notes (Signed)
SATURATION QUALIFICATIONS: (This note is used to comply with regulatory documentation for home oxygen)  Patient Saturations on Room Air at Rest = 99%  Patient Saturations on Room Air while Ambulating = 95%  Patient Saturations on 0 Liters of oxygen while Ambulating = n/a%  Please briefly explain why patient needs home oxygen: 

## 2022-07-04 NOTE — Plan of Care (Signed)
  Problem: Health Behavior/Discharge Planning: Goal: Ability to manage health-related needs will improve Outcome: Progressing   Problem: Clinical Measurements: Goal: Ability to maintain clinical measurements within normal limits will improve Outcome: Progressing Goal: Will remain free from infection Outcome: Progressing Goal: Cardiovascular complication will be avoided Outcome: Progressing   

## 2022-07-04 NOTE — Progress Notes (Signed)
Mobility Specialist Progress Note   07/04/22 1100  Mobility  Activity Ambulated with assistance in hallway;Ambulated with assistance to bathroom  Level of Assistance Modified independent, requires aide device or extra time  Assistive Device None  Distance Ambulated (ft) 300 ft  Range of Motion/Exercises Active;All extremities  Activity Response Tolerated well   Pre Ambulation:  HR 86, SpO2 100% RA During Ambulation: HR 106, SpO2 95-97% RA Post Ambulation: HR 108, SpO2 97% RA  Patient received in supine eager to participate. Is independent for bed mobility and ambulated independently with steady gait. Urgently returned to room to use bathroom but tolerated ambulation without complaint or incident. Oxygen saturation maintained >95% on RA. Was left in recliner with all needs met, call bell in reach.   [place] Jaxsyn Catalfamo, BS EXP Mobility Specialist Please contact via SecureChat or Rehab office at [phone]

## 2022-07-05 MED ORDER — CLOPIDOGREL BISULFATE 75 MG PO TABS: 75.0000 mg | ORAL_TABLET | Freq: Every day | ORAL | 3 refills | Status: DC

## 2022-07-05 MED ORDER — OXYCODONE-ACETAMINOPHEN 5-325 MG PO TABS: 1.0000 | ORAL_TABLET | Freq: Four times a day (QID) | ORAL | 0 refills | Status: DC | PRN

## 2022-07-05 NOTE — Progress Notes (Addendum)
  Progress Note    07/05/2022 7:33 AM 2 Days Post-Op  Subjective:  no complaints   Vitals:   07/05/22 0028 07/05/22 0504  BP: (!) 147/80 (!) 145/73  Pulse: 98 92  Resp: 20 20  Temp: 98.4 F (36.9 C) 98 F (36.7 C)  SpO2: 94% 93%   Physical Exam: Lungs:  non labored Incisions:  B groin incisions c/d/I; local ecchymosis R groin Extremities:  brisk DP signals; calf soft bilaterally Neurologic: A&O  CBC    Component Value Date/Time   WBC 10.7 (H) 07/04/2022 0430   RBC 3.12 (L) 07/04/2022 0430   HGB 9.8 (L) 07/04/2022 0430   HGB 14.2 07/16/2021 1209   HCT 28.6 (L) 07/04/2022 0430   HCT 40.5 07/16/2021 1209   PLT 137 (L) 07/04/2022 0430   PLT 236 07/16/2021 1209   MCV 91.7 07/04/2022 0430   MCV 89 07/16/2021 1209   MCH 31.4 07/04/2022 0430   MCHC 34.3 07/04/2022 0430   RDW 13.0 07/04/2022 0430   RDW 12.1 07/16/2021 1209   LYMPHSABS 1.4 07/16/2021 1209   MONOABS 740 06/06/2016 1038   EOSABS 0.1 07/16/2021 1209   BASOSABS 0.0 07/16/2021 1209    BMET    Component Value Date/Time   NA 135 07/04/2022 0430   NA 143 06/30/2022 1525   K 4.8 07/04/2022 0430   CL 104 07/04/2022 0430   CO2 22 07/04/2022 0430   GLUCOSE 121 (H) 07/04/2022 0430   BUN 12 07/04/2022 0430   BUN 12 06/30/2022 1525   CREATININE 0.87 07/04/2022 0430   CREATININE 0.93 06/06/2016 1038   CALCIUM 8.3 (L) 07/04/2022 0430   GFRNONAA >60 07/04/2022 0430   GFRAA 90 06/28/2019 1000    INR    Component Value Date/Time   INR 1.3 (H) 07/03/2022 1133     Intake/Output Summary (Last 24 hours) at 07/05/2022 [id number] Last data filed at 07/05/2022 0505 Gross per 24 hour  Intake --  Output 700 ml  Net -700 ml     Assessment/Plan:  82 y.o. male is s/p EVAR with R groin cutdown 2 Days Post-Op   BLE well perfused by doppler exam and symmetrically warm Groin incisions are healing well Ok for discharge home; office will arrange 1 month CTA a/p   Dagoberto Ligas, PA-C [facility] [phone] 07/05/2022 7:33 AM  I have seen and evaluated the patient. I agree with the PA note as documented above.  Status post EVAR requiring a right groin cutdown with repair.  Groins look good today.  Brisk PT signals in both lower extremities.  No lower extremity symptoms.  Discussed plan for discharge.  Aspirin Plavix statin.  Discussed plavix to be new prescription.  Arrange follow-up in 1 month with CTA  Marty Heck, MD [facility]: [phone]

## 2022-07-05 NOTE — Progress Notes (Signed)
Alert and oriented, verbalized understanding of dc instructions. Piv dcd, site unremarkable. Ccmd notified of dc order. All belongings and dc papers given to patient.

## 2022-07-05 NOTE — Plan of Care (Signed)
  Problem: Education: Goal: Knowledge of General Education information will improve Description: Including pain rating scale, medication(s)/side effects and non-pharmacologic comfort measures Outcome: Adequate for Discharge   Problem: Health Behavior/Discharge Planning: Goal: Ability to manage health-related needs will improve Outcome: Adequate for Discharge   Problem: Clinical Measurements: Goal: Ability to maintain clinical measurements within normal limits will improve Outcome: Adequate for Discharge Goal: Will remain free from infection Outcome: Adequate for Discharge Goal: Diagnostic test results will improve Outcome: Adequate for Discharge Goal: Respiratory complications will improve Outcome: Adequate for Discharge Goal: Cardiovascular complication will be avoided Outcome: Adequate for Discharge   Problem: Activity: Goal: Risk for activity intolerance will decrease Outcome: Adequate for Discharge   Problem: Nutrition: Goal: Adequate nutrition will be maintained Outcome: Adequate for Discharge   Problem: Coping: Goal: Level of anxiety will decrease Outcome: Adequate for Discharge   Problem: Pain Managment: Goal: General experience of comfort will improve Outcome: Adequate for Discharge   Problem: Safety: Goal: Ability to remain free from injury will improve Outcome: Adequate for Discharge   Problem: Skin Integrity: Goal: Risk for impaired skin integrity will decrease Outcome: Adequate for Discharge

## 2022-07-06 ENCOUNTER — Encounter (HOSPITAL_COMMUNITY): Payer: Self-pay | Admitting: Vascular Surgery

## 2022-07-07 ENCOUNTER — Telehealth: Payer: Self-pay

## 2022-07-07 NOTE — Transitions of Care (Post Inpatient/ED Visit) (Signed)
   07/07/2022  Name: Jacob Bowen MRN: [id number] DOB: 28-Jan-1941  Today's TOC FU Call Status: Today's TOC FU Call Status:: Successful TOC FU Call Competed TOC FU Call Complete Date: 07/07/22  Transition Care Management Follow-up Telephone Call Date of Discharge: 07/05/22 Discharge Facility: [facility]) Type of Discharge: Inpatient Admission Primary Inpatient Discharge Diagnosis:: "s/p AAA repair using bifurcation graft" How have you been since you were released from the hospital?: Better (Patient voices he is doing "exceptionally well." incison loks good and is healing. His pain is controlled and he is only having to take Tylenol only occassionally.) Any questions or concerns?: No  Items Reviewed: Did you receive and understand the discharge instructions provided?: Yes Medications obtained and verified?: Yes (Medications Reviewed) Any new allergies since your discharge?: No Dietary orders reviewed?: Yes Type of Diet Ordered:: low salt/heart healthy Do you have support at home?: Yes People in Home: child(ren), adult Name of Support/Comfort Primary Source: [facility] and Equipment/Supplies: [place] Ordered?: NA Any new equipment or medical supplies ordered?: NA  Functional Questionnaire: Do you need assistance with bathing/showering or dressing?: No Do you need assistance with meal preparation?: No Do you need assistance with eating?: No Do you have difficulty maintaining continence: No Do you need assistance with getting out of bed/getting out of a chair/moving?: No Do you have difficulty managing or taking your medications?: No  Folllow up appointments reviewed: PCP Follow-up appointment confirmed?: NA (Patient voices he was not advised to follow up with PCP post discharge-just surgeon-he already has annual phsycial appt with PCP on 07/23/22 and did not want to follow up post discharge) [facility] Follow-up appointment confirmed?:  No Reason Specialist Follow-Up Not Confirmed: Patient has Specialist Provider Number and will Call for Appointment (office to call and arrange appt with pt-he has contact info to follow up with them if he does not hear from them in a few days) Do you need transportation to your follow-up appointment?: No Do you understand care options if your condition(s) worsen?: Yes-patient verbalized understanding  SDOH Interventions Today    Flowsheet Row Most Recent Value  SDOH Interventions   Food Insecurity Interventions Intervention Not Indicated  Transportation Interventions Intervention Not Indicated      TOC Interventions Today    Flowsheet Row Most Recent Value  TOC Interventions   TOC Interventions Discussed/Reviewed TOC Interventions Discussed, Post discharge activity limitations per provider, Post op wound/incision care, S/S of infection      Interventions Today    Flowsheet Row Most Recent Value  Education Interventions   Education Provided Provided Education  Provided Verbal Education On Nutrition, Medication, When to see the doctor  Nutrition Interventions   Nutrition Discussed/Reviewed Nutrition Discussed  Pharmacy Interventions   Pharmacy Dicussed/Reviewed Pharmacy Topics Discussed, Medications and their functions  Safety Interventions   Safety Discussed/Reviewed Safety Discussed, Fall Risk       Hetty Blend [facility] Care Management Care Management Community Coordinator Direct Phone: [phone] Toll Free: [phone] Fax: [phone]

## 2022-07-07 NOTE — Discharge Summary (Signed)
EVAR Discharge Summary   Jacob Bowen 1940/11/22 82 y.o. male  MRN: [id number]  Admission Date: 07/03/2022  Discharge Date: 07/05/22  Physician: Dr. Donzetta Matters  Admission Diagnosis: S/P AAA repair using bifurcation graft [id number], Z86.79]  Discharge Day services:   See progress note 3/2  Hospital Course:  Mr. Jacob Bowen is a 82 year old male who was brought in as an outpatient and underwent endovascular repair of abdominal aortic aneurysm with stenting of the right and left external iliac artery involving open exposure of the right common femoral artery and femoral endarterectomy with bovine patch angioplasty by Dr. Donzetta Matters on 07/03/2022.  He tolerated the procedure well and was admitted to the hospital postoperatively.  Hospital course consisted of increasing mobility and postoperative pain control.  Throughout his hospital stay he maintained brisk Doppler signals of bilateral lower extremities.  Right groin incision was well-appearing.  He was provided prescription for Plavix in addition to pain medication at discharge.  He will also continue his aspirin.  He will follow-up in office in about 1 month with a CTA abdomen and pelvis to see Dr. Donzetta Matters.  He was discharged home in stable condition.  CBC    Component Value Date/Time   WBC 10.7 (H) 07/04/2022 0430   RBC 3.12 (L) 07/04/2022 0430   HGB 9.8 (L) 07/04/2022 0430   HGB 14.2 07/16/2021 1209   HCT 28.6 (L) 07/04/2022 0430   HCT 40.5 07/16/2021 1209   PLT 137 (L) 07/04/2022 0430   PLT 236 07/16/2021 1209   MCV 91.7 07/04/2022 0430   MCV 89 07/16/2021 1209   MCH 31.4 07/04/2022 0430   MCHC 34.3 07/04/2022 0430   RDW 13.0 07/04/2022 0430   RDW 12.1 07/16/2021 1209   LYMPHSABS 1.4 07/16/2021 1209   MONOABS 740 06/06/2016 1038   EOSABS 0.1 07/16/2021 1209   BASOSABS 0.0 07/16/2021 1209    BMET    Component Value Date/Time   NA 135 07/04/2022 0430   NA 143 06/30/2022 1525   K 4.8 07/04/2022 0430   CL 104 07/04/2022 0430    CO2 22 07/04/2022 0430   GLUCOSE 121 (H) 07/04/2022 0430   BUN 12 07/04/2022 0430   BUN 12 06/30/2022 1525   CREATININE 0.87 07/04/2022 0430   CREATININE 0.93 06/06/2016 1038   CALCIUM 8.3 (L) 07/04/2022 0430   GFRNONAA >60 07/04/2022 0430   GFRAA 90 06/28/2019 1000         Discharge Diagnosis:  S/P AAA repair using bifurcation graft [id number], Z86.79]  Secondary Diagnosis: Patient Active Problem List   Diagnosis Date Noted   S/P AAA repair using bifurcation graft 07/03/2022   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   Abdominal aortic aneurysm (AAA) without rupture ([place]) 02/24/2022   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   Benign prostatic hyperplasia without lower urinary tract symptoms 06/06/2016   FH: colonic polyps 06/06/2016   Hyperlipidemia LDL goal <130 05/08/2014   Elevated PSA 04/11/2011   Hypertension 04/11/2011   Hemorrhoids 04/11/2011   Past Medical History:  Diagnosis Date   AAA (abdominal aortic aneurysm) (HCC)    Femoral hernia, bilateral, s/p lap repair 04/13/2012   Hemorrhoids    EXTERNAL   Hypertension      Allergies as of 07/05/2022   No Known Allergies      Medication List     TAKE these medications    acetaminophen 325 MG tablet Commonly known as: TYLENOL Take 650 mg by mouth every 6 (six) hours as needed for moderate pain.  aspirin EC 81 MG tablet Take 1 tablet (81 mg total) by mouth daily. Swallow whole.   clopidogrel 75 MG tablet Commonly known as: PLAVIX Take 1 tablet (75 mg total) by mouth daily at 6 (six) AM.   doxazosin 4 MG tablet Commonly known as: CARDURA Take 1 tablet (4 mg total) by mouth daily.   finasteride 5 MG tablet Commonly known as: PROSCAR TAKE 1 TABLET (5 MG TOTAL) BY MOUTH DAILY.   lactose free nutrition Liqd Take 237 mLs by mouth daily.   lisinopril-hydrochlorothiazide 10-12.5 MG tablet Commonly known as: ZESTORETIC Take 1 tablet by mouth daily.   oxyCODONE-acetaminophen 5-325 MG  tablet Commonly known as: PERCOCET/ROXICET Take 1 tablet by mouth every 6 (six) hours as needed for moderate pain.   rosuvastatin 10 MG tablet Commonly known as: Crestor Take 1 tablet (10 mg total) by mouth daily.        Discharge Instructions:   [facility]  Discharge Instructions Endovascular Aortic Aneurysm Repair  Please refer to the following instructions for your post-procedure care. Your surgeon or Physician Assistant will discuss any changes with you.  Activity  You are encouraged to walk as much as you can. You can slowly return to normal activities but must avoid strenuous activity and heavy lifting until your doctor tells you it's OK. Avoid activities such as vacuuming or swinging a gold club. It is normal to feel tired for several weeks after your surgery. Do not drive until your doctor gives the OK and you are no longer taking prescription pain medications. It is also normal to have difficulty with sleep habits, eating, and bowel movements after surgery. These will go away with time.  Bathing/Showering  You may shower after you go home. If you have an incision, do not soak in a bathtub, hot tub, or swim until the incision heals completely.  Incision Care  Shower every day. Clean your incision with mild soap and water. Pat the area dry with a clean towel. You do not need a bandage unless otherwise instructed. Do not apply any ointments or creams to your incision. If you clothing is irritating, you may cover your incision with a dry gauze pad.  Diet  Resume your normal diet. There are no special food restrictions following this procedure. A low fat/low cholesterol diet is recommended for all patients with vascular disease. In order to heal from your surgery, it is CRITICAL to get adequate nutrition. Your body requires vitamins, minerals, and protein. Vegetables are the best source of vitamins and minerals. Vegetables also provide the perfect  balance of protein. Processed food has little nutritional value, so try to avoid this.  Medications  Resume taking all of your medications unless your doctor or Physician Assistnat tells you not to. If your incision is causing pain, you may take over-the-counter pain relievers such as acetaminophen (Tylenol). If you were prescribed a stronger pain medication, please be aware these medications can cause nausea and constipation. Prevent nausea by taking the medication with a snack or meal. Avoid constipation by drinking plenty of fluids and eating foods with a high amount of fiber, such as fruits, vegetables, and grains. Do not take Tylenol if you are taking prescription pain medications.   Follow up  [facility] office will schedule a follow-up appointment with a C.T. scan 3-4 weeks after your surgery.  Please call us immediately for any of the following conditions  Severe or worsening pain in your legs or feet or in your  abdomen back or chest. Increased pain, redness, drainage (pus) from your incision sit. Increased abdominal pain, bloating, nausea, vomiting or persistent diarrhea. Fever of 101 degrees or higher. Swelling in your leg (s),  Reduce your risk of vascular disease  Stop smoking. If you would like help call [facility] at 1-800-QUIT-NOW 4423161320) or [place] at ([phone]. Manage your cholesterol Maintain a desired weight Control your diabetes Keep your blood pressure down  If you have questions, please call the office at [phone].    Disposition: home  Patient's condition: is Good  Follow up: 1. Dr. Donzetta Matters in 4 weeks with CTA protocol   Dagoberto Ligas, PA-C [facility] [phone] 07/07/2022  8:58 AM   - For VQI Registry use - Post-op:  Time to Extubation: '[x]'$  In OR, '[ ]'$  < 12 hrs, '[ ]'$  12-24 hrs, '[ ]'$  >=24 hrs Vasopressors Req. Post-op: No MI: No., '[ ]'$  Troponin only, '[ ]'$  EKG or Clinical New Arrhythmia: No CHF: No ICU Stay: 0  days Transfusion: No    Complications: Resp failure: No., '[ ]'$  Pneumonia, '[ ]'$  Ventilator Chg in renal function: No., '[ ]'$  Inc. Cr > 0.5, '[ ]'$  Temp. Dialysis,  '[ ]'$  Permanent dialysis Leg ischemia: No., no Surgery needed, '[ ]'$  Yes, Surgery needed,  '[ ]'$  Amputation Bowel ischemia: No., '[ ]'$  Medical Rx, '[ ]'$  Surgical Rx Wound complication: No., '[ ]'$  Superficial separation/infection, '[ ]'$  Return to OR Return to OR: No  Return to OR for bleeding: No Stroke: No., '[ ]'$  Minor, '[ ]'$  Major  Discharge medications: Statin use:  Yes  ASA use:  Yes  Plavix use:  Yes  Beta blocker use:  No  ARB use:  No ACEI use:  Yes CCB use:  No

## 2022-07-22 ENCOUNTER — Ambulatory Visit (INDEPENDENT_AMBULATORY_CARE_PROVIDER_SITE_OTHER): Payer: Medicare Other

## 2022-07-22 VITALS — Ht 73.0 in | Wt 196.0 lb

## 2022-07-22 DIAGNOSIS — Z0000 Encounter for general adult medical examination without abnormal findings: Secondary | ICD-10-CM | POA: Diagnosis not present

## 2022-07-22 NOTE — Patient Instructions (Signed)
Jacob Bowen , Thank you for taking time to come for your Medicare Wellness Visit. I appreciate your ongoing commitment to your health goals. Please review the following plan we discussed and let me know if I can assist you in the future.   These are the goals we discussed:  Goals      Patient Stated     07/22/2022, wants to build up strength        This is a list of the screening recommended for you and due dates:  Health Maintenance  Topic Date Due   Medicare Annual Wellness Visit  07/22/2023   DTaP/Tdap/Td vaccine (3 - Td or Tdap) 07/09/2031   Flu Shot  Completed   COVID-19 Vaccine  Completed   Hepatitis C Screening: USPSTF Recommendation to screen - Ages 18-79 yo.  Completed   Zoster (Shingles) Vaccine  Completed   HPV Vaccine  Aged Out   Pneumonia Vaccine  Discontinued   Colon Cancer Screening  Discontinued    Advanced directives: Advance directive discussed with you today. .  Conditions/risks identified: none  Next appointment: Follow up in one year for your annual wellness visit.   Preventive Care 3 Years and Older, Male  Preventive care refers to lifestyle choices and visits with your health care provider that can promote health and wellness. What does preventive care include? A yearly physical exam. This is also called an annual well check. Dental exams once or twice a year. Routine eye exams. Ask your health care provider how often you should have your eyes checked. Personal lifestyle choices, including: Daily care of your teeth and gums. Regular physical activity. Eating a healthy diet. Avoiding tobacco and drug use. Limiting alcohol use. Practicing safe sex. Taking low doses of aspirin every day. Taking vitamin and mineral supplements as recommended by your health care provider. What happens during an annual well check? The services and screenings done by your health care provider during your annual well check will depend on your age, overall health,  lifestyle risk factors, and family history of disease. Counseling  Your health care provider may ask you questions about your: Alcohol use. Tobacco use. Drug use. Emotional well-being. Home and relationship well-being. Sexual activity. Eating habits. History of falls. Memory and ability to understand (cognition). Work and work Statistician. Screening  You may have the following tests or measurements: Height, weight, and BMI. Blood pressure. Lipid and cholesterol levels. These may be checked every 5 years, or more frequently if you are over 63 years old. Skin check. Lung cancer screening. You may have this screening every year starting at age 4 if you have a 30-pack-year history of smoking and currently smoke or have quit within the past 15 years. Fecal occult blood test (FOBT) of the stool. You may have this test every year starting at age 48. Flexible sigmoidoscopy or colonoscopy. You may have a sigmoidoscopy every 5 years or a colonoscopy every 10 years starting at age 37. Prostate cancer screening. Recommendations will vary depending on your family history and other risks. Hepatitis C blood test. Hepatitis B blood test. Sexually transmitted disease (STD) testing. Diabetes screening. This is done by checking your blood sugar (glucose) after you have not eaten for a while (fasting). You may have this done every 1-3 years. Abdominal aortic aneurysm (AAA) screening. You may need this if you are a current or former smoker. Osteoporosis. You may be screened starting at age 48 if you are at high risk. Talk with your health care provider  about your test results, treatment options, and if necessary, the need for more tests. Vaccines  Your health care provider may recommend certain vaccines, such as: Influenza vaccine. This is recommended every year. Tetanus, diphtheria, and acellular pertussis (Tdap, Td) vaccine. You may need a Td booster every 10 years. Zoster vaccine. You may need this  after age 5. Pneumococcal 13-valent conjugate (PCV13) vaccine. One dose is recommended after age 59. Pneumococcal polysaccharide (PPSV23) vaccine. One dose is recommended after age 75. Talk to your health care provider about which screenings and vaccines you need and how often you need them. This information is not intended to replace advice given to you by your health care provider. Make sure you discuss any questions you have with your health care provider. Document Released: 05/18/2015 Document Revised: 01/09/2016 Document Reviewed: 02/20/2015 Elsevier Interactive Patient Education  2017 [place] Prevention in the Home Falls can cause injuries. They can happen to people of all ages. There are many things you can do to make your home safe and to help prevent falls. What can I do on the outside of my home? Regularly fix the edges of walkways and driveways and fix any cracks. Remove anything that might make you trip as you walk through a door, such as a raised step or threshold. Trim any bushes or trees on the path to your home. Use bright outdoor lighting. Clear any walking paths of anything that might make someone trip, such as rocks or tools. Regularly check to see if handrails are loose or broken. Make sure that both sides of any steps have handrails. Any raised decks and porches should have guardrails on the edges. Have any leaves, snow, or ice cleared regularly. Use sand or salt on walking paths during winter. Clean up any spills in your garage right away. This includes oil or grease spills. What can I do in the bathroom? Use night lights. Install grab bars by the toilet and in the tub and shower. Do not use towel bars as grab bars. Use non-skid mats or decals in the tub or shower. If you need to sit down in the shower, use a plastic, non-slip stool. Keep the floor dry. Clean up any water that spills on the floor as soon as it happens. Remove soap buildup in the tub or  shower regularly. Attach bath mats securely with double-sided non-slip rug tape. Do not have throw rugs and other things on the floor that can make you trip. What can I do in the bedroom? Use night lights. Make sure that you have a light by your bed that is easy to reach. Do not use any sheets or blankets that are too big for your bed. They should not hang down onto the floor. Have a firm chair that has side arms. You can use this for support while you get dressed. Do not have throw rugs and other things on the floor that can make you trip. What can I do in the kitchen? Clean up any spills right away. Avoid walking on wet floors. Keep items that you use a lot in easy-to-reach places. If you need to reach something above you, use a strong step stool that has a grab bar. Keep electrical cords out of the way. Do not use floor polish or wax that makes floors slippery. If you must use wax, use non-skid floor wax. Do not have throw rugs and other things on the floor that can make you trip. What can I do  with my stairs? Do not leave any items on the stairs. Make sure that there are handrails on both sides of the stairs and use them. Fix handrails that are broken or loose. Make sure that handrails are as long as the stairways. Check any carpeting to make sure that it is firmly attached to the stairs. Fix any carpet that is loose or worn. Avoid having throw rugs at the top or bottom of the stairs. If you do have throw rugs, attach them to the floor with carpet tape. Make sure that you have a light switch at the top of the stairs and the bottom of the stairs. If you do not have them, ask someone to add them for you. What else can I do to help prevent falls? Wear shoes that: Do not have high heels. Have rubber bottoms. Are comfortable and fit you well. Are closed at the toe. Do not wear sandals. If you use a stepladder: Make sure that it is fully opened. Do not climb a closed stepladder. Make  sure that both sides of the stepladder are locked into place. Ask someone to hold it for you, if possible. Clearly mark and make sure that you can see: Any grab bars or handrails. First and last steps. Where the edge of each step is. Use tools that help you move around (mobility aids) if they are needed. These include: Canes. Walkers. Scooters. Crutches. Turn on the lights when you go into a dark area. Replace any light bulbs as soon as they burn out. Set up your furniture so you have a clear path. Avoid moving your furniture around. If any of your floors are uneven, fix them. If there are any pets around you, be aware of where they are. Review your medicines with your doctor. Some medicines can make you feel dizzy. This can increase your chance of falling. Ask your doctor what other things that you can do to help prevent falls. This information is not intended to replace advice given to you by your health care provider. Make sure you discuss any questions you have with your health care provider. Document Released: 02/15/2009 Document Revised: 09/27/2015 Document Reviewed: 05/26/2014 Elsevier Interactive Patient Education  2017 [employer].

## 2022-07-22 NOTE — Progress Notes (Addendum)
I connected with  Jarome Matin on 07/22/22 by a audio enabled telemedicine application and verified that I am speaking with the correct person using two identifiers.  Patient Location: Home  Provider Location: Office/Clinic  I discussed the limitations of evaluation and management by telemedicine. The patient expressed understanding and agreed to proceed.  Subjective:   Jacob Bowen is a 81 y.o. male who presents for Medicare Annual/Subsequent preventive examination.  Patient Medicare AWV questionnaire was completed by the patient on 07/16/2022; I have confirmed that all information answered by patient is correct and no changes since this date.     Review of Systems     Cardiac Risk Factors include: advanced age (>50men, >104 women);dyslipidemia;hypertension;male gender     Objective:    Today's Vitals   07/22/22 1416  Weight: 196 lb (88.9 kg)  Height: 6\' 1"  (1.854 m)   Body mass index is 25.86 kg/m.     07/22/2022    2:24 PM 06/30/2022   11:07 AM 07/16/2021   11:09 AM 07/03/2020    4:06 PM 06/28/2019   10:22 AM 06/22/2017    2:03 PM 06/06/2016   10:17 AM  Advanced Directives  Does Patient Have a Medical Advance Directive? No No No No No No No  Would patient like information on creating a medical advance directive?   Yes (ED - Information included in AVS) Yes (MAU/Ambulatory/Procedural Areas - Information given) Yes (MAU/Ambulatory/Procedural Areas - Information given)  Yes (MAU/Ambulatory/Procedural Areas - Information given)    Current Medications (verified) Outpatient Encounter Medications as of 07/22/2022  Medication Sig   acetaminophen (TYLENOL) 325 MG tablet Take 650 mg by mouth every 6 (six) hours as needed for moderate pain.   aspirin EC 81 MG tablet Take 1 tablet (81 mg total) by mouth daily. Swallow whole.   clopidogrel (PLAVIX) 75 MG tablet Take 1 tablet (75 mg total) by mouth daily at 6 (six) AM.   doxazosin (CARDURA) 4 MG tablet Take 1 tablet (4 mg total) by  mouth daily.   finasteride (PROSCAR) 5 MG tablet TAKE 1 TABLET (5 MG TOTAL) BY MOUTH DAILY.   lactose free nutrition (BOOST) LIQD Take 237 mLs by mouth daily.   lisinopril-hydrochlorothiazide (ZESTORETIC) 10-12.5 MG tablet Take 1 tablet by mouth daily.   rosuvastatin (CRESTOR) 10 MG tablet Take 1 tablet (10 mg total) by mouth daily.   oxyCODONE-acetaminophen (PERCOCET/ROXICET) 5-325 MG tablet Take 1 tablet by mouth every 6 (six) hours as needed for moderate pain. (Patient not taking: Reported on 07/22/2022)   No facility-administered encounter medications on file as of 07/22/2022.    Allergies (verified) Patient has no known allergies.   History: Past Medical History:  Diagnosis Date   AAA (abdominal aortic aneurysm) ([place])    Femoral hernia, bilateral, s/p lap repair 04/13/2012   Hemorrhoids    EXTERNAL   Hypertension    Past Surgical History:  Procedure Laterality Date   ABDOMINAL AORTIC ENDOVASCULAR STENT GRAFT N/A 07/03/2022   Procedure: ABDOMINAL AORTIC ENDOVASCULAR STENT GRAFT;  Surgeon: Waynetta Sandy, MD;  Location: [place];  Service: Vascular;  Laterality: N/A;   COLONOSCOPY     COLONOSCOPY W/ POLYPECTOMY     INGUINAL HERNIA REPAIR Right    INSERTION OF ILIAC STENT Bilateral 07/03/2022   Procedure: INSERTION OF ILIAC STENTS;  Surgeon: Waynetta Sandy, MD;  Location: [facility];  Service: Vascular;  Laterality: Bilateral;   PATCH ANGIOPLASTY Right 07/03/2022   Procedure: PATCH ANGIOPLASTY RIGHT COMMON FEMORAL ARTERY;  Surgeon:  Waynetta Sandy, MD;  Location: [facility] OR;  Service: Vascular;  Laterality: Right;   THROMBECTOMY FEMORAL ARTERY Right 07/03/2022   Procedure: THROMBECTOMY FEMORAL ARTERY;  Surgeon: Waynetta Sandy, MD;  Location: [place];  Service: Vascular;  Laterality: Right;   ULTRASOUND GUIDANCE FOR VASCULAR ACCESS Bilateral 07/03/2022   Procedure: ULTRASOUND GUIDANCE FOR VASCULAR ACCESS;  Surgeon: Waynetta Sandy, MD;  Location: [facility];  Service: Vascular;  Laterality: Bilateral;   Family History  Problem Relation Age of Onset   Hypertension Father    Social History   Socioeconomic History   Marital status: Divorced    Spouse name: Not on file   Number of children: Not on file   Years of education: Not on file   Highest education level: Not on file  Occupational History   Not on file  Tobacco Use   Smoking status: Never    Passive exposure: Never   Smokeless tobacco: Never  Vaping Use   Vaping Use: Never used  Substance and Sexual Activity   Alcohol use: No    Comment: rare   Drug use: Not Currently    Types: Marijuana   Sexual activity: Not Currently  Other Topics Concern   Not on file  Social History Narrative   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   Social Determinants of Health   Financial Resource Strain: Low Risk  (07/16/2022)   Overall Financial Resource Strain (CARDIA)    Difficulty of Paying Living Expenses: Not hard at all  Food Insecurity: No Food Insecurity (07/22/2022)   Hunger Vital Sign    Worried About Running Out of Food in the Last Year: Never true    [place] in the Last Year: Never true  Transportation Needs: No Transportation Needs (07/16/2022)   PRAPARE - Hydrologist (Medical): No    Lack of Transportation (Non-Medical): No  Physical Activity: Sufficiently Active (07/16/2022)   Exercise Vital Sign    Days of Exercise per Week: 5 days    Minutes of Exercise per Session: 30 min  Stress: No Stress Concern Present (07/16/2022)   [place]    Feeling of Stress : Not at all  Social Connections: Unknown (07/16/2022)   Social Connection and Isolation Panel [NHANES]    Frequency of Communication with Friends and Family: Three times a week    Frequency of Social Gatherings with Friends and Family: Once a week    Attends  Religious Services: Not on Advertising copywriter or Organizations: Yes    Attends Music therapist: More than 4 times per year    Marital Status: Divorced    Tobacco Counseling Counseling given: Not Answered   Clinical Intake:  Pre-visit preparation completed: Yes  Pain : No/denies pain     Nutritional Status: BMI 25 -29 Overweight Nutritional Risks: None Diabetes: No  How often do you need to have someone help you when you read instructions, pamphlets, or other written materials from your doctor or pharmacy?: 1 - Never  Diabetic? no  Interpreter Needed?: No  Information entered by :: NAllen LPN   Activities of Daily Living    07/16/2022   12:20 PM 06/30/2022   11:10 AM  In your present state of health, do you have any difficulty performing the following activities:  Hearing? 1  Comment wears hearing aid   Vision? 0   Difficulty concentrating or making decisions? 0   Walking or climbing stairs? 0   Dressing or bathing? 0   Doing errands, shopping? 0 0  Preparing Food and eating ? N   Using the Toilet? N   In the past six months, have you accidently leaked urine? N   Do you have problems with loss of bowel control? N   Managing your Medications? N   Managing your Finances? N   Housekeeping or managing your Housekeeping? N     Patient Care Team: Denita Lung, MD as PCP - General (Family Medicine)  Indicate any recent Medical Services you may have received from other than [facility] providers in the past year (date may be approximate).     Assessment:   This is a routine wellness examination for [place].  Hearing/Vision screen Vision Screening - Comments:: Regular eye exams, [facility]  Dietary issues and exercise activities discussed: Current Exercise Habits: Home exercise routine, Type of exercise: walking, Time (Minutes): 30, Frequency (Times/Week): 5, Weekly Exercise (Minutes/Week): 150   Goals Addressed             This  Visit's Progress    Patient Stated       07/22/2022, wants to build up strength       Depression Screen    07/22/2022    2:25 PM 07/16/2021   11:14 AM 07/03/2020    4:06 PM 06/28/2019    9:50 AM 06/24/2018    9:27 AM 06/22/2017    1:43 PM 06/06/2016    9:54 AM  PHQ 2/9 Scores  PHQ - 2 Score 0 0 0 0 0 0 0  PHQ- 9 Score 1          Fall Risk    07/16/2022   12:20 PM 07/16/2021   11:10 AM 07/03/2020    4:06 PM 06/28/2019    9:50 AM 06/24/2018    9:27 AM  [place] in the past year? 0 0 0 0 0  Number falls in past yr: 0 0 0    Injury with Fall? 0 0 0    Risk for fall due to : Medication side effect No Fall Risks No Fall Risks    Follow up Falls prevention discussed;Education provided;Falls evaluation completed Falls evaluation completed Falls evaluation completed      FALL RISK PREVENTION PERTAINING TO THE HOME:  Any stairs in or around the home? No  If so, are there any without handrails? N/a Home free of loose throw rugs in walkways, pet beds, electrical cords, etc? Yes  Adequate lighting in your home to reduce risk of falls? Yes   ASSISTIVE DEVICES UTILIZED TO PREVENT FALLS:  Life alert? No  Use of a cane, walker or w/c? No  Grab bars in the bathroom? No  Shower chair or bench in shower? Yes  Elevated toilet seat or a handicapped toilet? No   TIMED UP AND GO:  Was the test performed? No .      Cognitive Function:        07/22/2022    2:30 PM  6CIT Screen  What Year? 0 points  What month? 0 points  What time? 0 points  Count back from 20 0 points  Months in reverse 0 points  Repeat phrase 2 points  Total Score 2 points    Immunizations Immunization History  Administered Date(s) Administered   COVID-19, mRNA, vaccine(Comirnaty)12 years  and older 02/13/2022   DTaP 06/02/1994   Fluad Quad(high Dose 65+) 06/17/2021, 02/13/2022   Influenza Split 04/11/2011   Influenza Whole 05/17/2008, 05/18/2009   Influenza, High Dose Seasonal PF 05/08/2014,  05/21/2015, 06/06/2016, 05/24/2018   Influenza-Unspecified 06/20/2017, 05/24/2018   PFIZER Comirnaty(Gray Top)Covid-19 Tri-Sucrose Vaccine 11/14/2020   PFIZER(Purple Top)SARS-COV-2 Vaccination 06/30/2019, 07/26/2019, 03/03/2020   Pfizer Covid-19 Vaccine Bivalent Booster 23yrs & up 03/20/2021   Pneumococcal Conjugate-13 05/08/2014   Pneumococcal Polysaccharide-23 05/20/2006   Tdap 07/08/2021   Zoster Recombinat (Shingrix) 12/28/2019, 03/16/2020   Zoster, Live 05/20/2006    TDAP status: Up to date  Flu Vaccine status: Up to date  Pneumococcal vaccine status: Up to date  Covid-19 vaccine status: Completed vaccines  Qualifies for Shingles Vaccine? Yes   Zostavax completed Yes   Shingrix Completed?: Yes  Screening Tests Health Maintenance  Topic Date Due   Medicare Annual Wellness (AWV)  07/17/2022   DTaP/Tdap/Td (3 - Td or Tdap) 07/09/2031   INFLUENZA VACCINE  Completed   COVID-19 Vaccine  Completed   Hepatitis C Screening  Completed   Zoster Vaccines- Shingrix  Completed   HPV VACCINES  Aged Out   Pneumonia Vaccine 15+ Years old  Discontinued   COLONOSCOPY (Pts 45-11yrs Insurance coverage will need to be confirmed)  Discontinued    Health Maintenance  Health Maintenance Due  Topic Date Due   Medicare Annual Wellness (AWV)  07/17/2022    Colorectal cancer screening: No longer required.   Lung Cancer Screening: (Low Dose CT Chest recommended if Age 18-80 years, 30 pack-year currently smoking OR have quit w/in 15years.) does not qualify.   Lung Cancer Screening Referral: no  Additional Screening:  Hepatitis C Screening: does qualify; Completed 07/03/2020  Vision Screening: Recommended annual ophthalmology exams for early detection of glaucoma and other disorders of the eye. Is the patient up to date with their annual eye exam?  Yes  Who is the provider or what is the name of the office in which the patient attends annual eye exams? [facility] If pt is not  established with a provider, would they like to be referred to a provider to establish care? No .   Dental Screening: Recommended annual dental exams for proper oral hygiene  Community Resource Referral / Chronic Care Management: CRR required this visit?  No   CCM required this visit?  No      Plan:     I have personally reviewed and noted the following in the patient's chart:   Medical and social history Use of alcohol, tobacco or illicit drugs  Current medications and supplements including opioid prescriptions. Patient is not currently taking opioid prescriptions. Functional ability and status Nutritional status Physical activity Advanced directives List of other physicians Hospitalizations, surgeries, and ER visits in previous 12 months Vitals Screenings to include cognitive, depression, and falls Referrals and appointments  In addition, I have reviewed and discussed with patient certain preventive protocols, quality metrics, and best practice recommendations. A written personalized care plan for preventive services as well as general preventive health recommendations were provided to patient.     Kellie Simmering, LPN   [id number]   Nurse Notes: none  Due to this being a virtual visit, the after visit summary with patients personalized plan was offered to patient via mail or my-chart.  Patient would like to access on my-chart

## 2022-07-23 ENCOUNTER — Ambulatory Visit (INDEPENDENT_AMBULATORY_CARE_PROVIDER_SITE_OTHER): Payer: Medicare Other | Admitting: Family Medicine

## 2022-07-23 ENCOUNTER — Encounter: Payer: Self-pay | Admitting: Family Medicine

## 2022-07-23 VITALS — BP 130/68 | HR 84 | Temp 97.7°F | Resp 16 | Ht 71.75 in | Wt 200.0 lb

## 2022-07-23 DIAGNOSIS — N400 Enlarged prostate without lower urinary tract symptoms: Secondary | ICD-10-CM | POA: Diagnosis not present

## 2022-07-23 DIAGNOSIS — N401 Enlarged prostate with lower urinary tract symptoms: Secondary | ICD-10-CM

## 2022-07-23 DIAGNOSIS — E785 Hyperlipidemia, unspecified: Secondary | ICD-10-CM | POA: Diagnosis not present

## 2022-07-23 DIAGNOSIS — Z83719 Family history of colon polyps, unspecified: Secondary | ICD-10-CM | POA: Diagnosis not present

## 2022-07-23 DIAGNOSIS — Z0000 Encounter for general adult medical examination without abnormal findings: Secondary | ICD-10-CM

## 2022-07-23 DIAGNOSIS — Z8679 Personal history of other diseases of the circulatory system: Secondary | ICD-10-CM | POA: Diagnosis not present

## 2022-07-23 DIAGNOSIS — R3914 Feeling of incomplete bladder emptying: Secondary | ICD-10-CM | POA: Diagnosis not present

## 2022-07-23 DIAGNOSIS — Z95828 Presence of other vascular implants and grafts: Secondary | ICD-10-CM

## 2022-07-23 DIAGNOSIS — R972 Elevated prostate specific antigen [PSA]: Secondary | ICD-10-CM | POA: Diagnosis not present

## 2022-07-23 DIAGNOSIS — I10 Essential (primary) hypertension: Secondary | ICD-10-CM | POA: Diagnosis not present

## 2022-07-23 DIAGNOSIS — H9113 Presbycusis, bilateral: Secondary | ICD-10-CM

## 2022-07-23 MED ORDER — DOXAZOSIN MESYLATE 4 MG PO TABS: 4.0000 mg | ORAL_TABLET | Freq: Every day | ORAL | 3 refills | Status: DC

## 2022-07-23 MED ORDER — ROSUVASTATIN CALCIUM 10 MG PO TABS: 10.0000 mg | ORAL_TABLET | Freq: Every day | ORAL | 3 refills | Status: DC

## 2022-07-23 MED ORDER — FINASTERIDE 5 MG PO TABS: 5.0000 mg | ORAL_TABLET | Freq: Every day | ORAL | 0 refills | Status: DC

## 2022-07-23 MED ORDER — LISINOPRIL-HYDROCHLOROTHIAZIDE 10-12.5 MG PO TABS: 1.0000 | ORAL_TABLET | Freq: Every day | ORAL | 3 refills | Status: DC

## 2022-07-23 NOTE — Progress Notes (Signed)
Complete physical exam  Patient: Jacob Bowen   DOB: 03/24/1941   82 y.o. Male  MRN: [id number]  Subjective:    Chief Complaint  Patient presents with   Annual Exam    Had AWV with [facility] yesterday. Fasting. No additional concerns.     Jacob Bowen is a 82 y.o. male who presents today for a complete physical exam. He reports consuming a general diet. Home exercise routine includes walking. He generally feels fairly well. He reports sleeping fairly well. He recently had AAA repair as well as aorto iliac stenting bilaterally.  He also had a thrombectomy as part of that whole procedure.  He has done well since then and has an appointment scheduled in the near future follow-up with cardiovascular.  He is taking Plavix.  He presently has no chest or abdominal complaints.  He does state that his legs feel better postoperatively.  He was placed on Crestor and Plavix and seems to be tolerating these well.  Review of the record also indicates an elevated PSA as well as evidence of colonic polyps.  At this point both of these are status quo with no plans for follow-up on them.  He continues on finasteride for his BPH and is also taking Cardura and lisinopril/HCTZ for his blood pressure.  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  In general things are going quite well for him.  He continues to wear his hearing aids.   Most recent fall risk assessment:    07/16/2022   12:20 PM  [place] in the past year? 0  Number falls in past yr: 0  Injury with Fall? 0  Risk for fall due to : Medication side effect  Follow up Falls prevention discussed;Education provided;Falls evaluation completed     Most recent depression screenings:    07/22/2022    2:25 PM 07/16/2021   11:14 AM  PHQ 2/9 Scores  PHQ - 2 Score 0 0  PHQ- 9 Score 1    Vision:Within last year and Dental: Receives regular dental  care     Patient Care Team: Denita Lung, MD as PCP - General (Family Medicine)   Outpatient Medications Prior to Visit  Medication Sig   acetaminophen (TYLENOL) 325 MG tablet Take 650 mg by mouth every 6 (six) hours as needed for moderate pain.   aspirin EC 81 MG tablet Take 1 tablet (81 mg total) by mouth daily. Swallow whole.   clopidogrel (PLAVIX) 75 MG tablet Take 1 tablet (75 mg total) by mouth daily at 6 (six) AM.   lactose free nutrition (BOOST) LIQD Take 237 mLs by mouth daily.   oxyCODONE-acetaminophen (PERCOCET/ROXICET) 5-325 MG tablet Take 1 tablet by mouth every 6 (six) hours as needed for moderate pain. (Patient not taking: Reported on 07/22/2022)   [DISCONTINUED] doxazosin (CARDURA) 4 MG tablet Take 1 tablet (4 mg total) by mouth daily.   [DISCONTINUED] finasteride (PROSCAR) 5 MG tablet TAKE 1 TABLET (5 MG TOTAL) BY MOUTH DAILY.   [DISCONTINUED] lisinopril-hydrochlorothiazide (ZESTORETIC) 10-12.5 MG tablet Take 1 tablet by mouth daily.   [DISCONTINUED] rosuvastatin (CRESTOR) 10 MG tablet Take 1 tablet (10 mg total) by mouth daily.   No facility-administered medications prior to visit.    Review of Systems  All other systems reviewed and are negative.         Objective:  BP 130/68   Pulse 84   Temp 97.7 F (36.5 C) (Oral)   Resp 16   Ht 5' 11.75" (1.822 m)   Wt 200 lb (90.7 kg)   SpO2 98% Comment: room air  BMI 27.31 kg/m    Physical Exam  Alert and in no distress. Tympanic membranes and canals are normal. Pharyngeal area is normal. Neck is supple without adenopathy or thyromegaly. Cardiac exam shows a regular sinus rhythm without murmurs or gallops. Lungs are clear to auscultation.       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  Presbycusis of both ears  Immunization History  Administered Date(s) Administered   COVID-19, mRNA, vaccine(Comirnaty)12 years and older 02/13/2022   DTaP 06/02/1994   Fluad Quad(high Dose 65+) 06/17/2021, 02/13/2022   Influenza Split 04/11/2011   Influenza Whole 05/17/2008, 05/18/2009   Influenza, High Dose Seasonal PF 05/08/2014, 05/21/2015, 06/06/2016, 05/24/2018   Influenza-Unspecified 06/20/2017, 05/24/2018   PFIZER Comirnaty(Gray Top)Covid-19 Tri-Sucrose Vaccine 11/14/2020   PFIZER(Purple Top)SARS-COV-2 Vaccination 06/30/2019, 07/26/2019, 03/03/2020   Pfizer Covid-19 Vaccine Bivalent Booster 41yrs & up 03/20/2021   Pneumococcal Conjugate-13 05/08/2014   Pneumococcal Polysaccharide-23 05/20/2006   Tdap 07/08/2021   Zoster Recombinat (Shingrix) 12/28/2019, 03/16/2020   Zoster, Live 05/20/2006    Health Maintenance  Topic Date Due   Medicare Annual Wellness (AWV)  07/22/2023   DTaP/Tdap/Td (3 - Td or Tdap) 07/09/2031   INFLUENZA VACCINE  Completed   COVID-19 Vaccine  Completed   Hepatitis C Screening  Completed   Zoster Vaccines- Shingrix  Completed   HPV VACCINES  Aged Out   Pneumonia Vaccine 52+ Years old  Discontinued   COLONOSCOPY (Pts 45-45yrs Insurance coverage will need to be confirmed)  Discontinued    Discussed health benefits of physical activity, and encouraged him to engage in regular exercise appropriate for his age and condition.  Problem List Items Addressed This Visit     Benign prostatic hyperplasia without lower urinary tract symptoms   Relevant Medications   finasteride (PROSCAR) 5  MG tablet   doxazosin (CARDURA) 4 MG tablet   Elevated PSA   FH: colonic polyps   Hyperlipidemia LDL goal <130   Relevant Medications   rosuvastatin (CRESTOR) 10 MG tablet   lisinopril-hydrochlorothiazide (ZESTORETIC) 10-12.5 MG tablet   doxazosin (CARDURA) 4 MG tablet   Hypertension   Relevant Medications   rosuvastatin (CRESTOR) 10 MG tablet   lisinopril-hydrochlorothiazide (ZESTORETIC) 10-12.5 MG tablet   doxazosin (CARDURA) 4 MG tablet   S/P AAA repair using bifurcation graft   Other Visit Diagnoses     Routine general medical examination at a health care facility    -  Primary   Essential hypertension       Relevant Medications   rosuvastatin (CRESTOR) 10 MG tablet   lisinopril-hydrochlorothiazide (ZESTORETIC) 10-12.5 MG tablet   doxazosin (CARDURA) 4 MG tablet   Benign prostatic hyperplasia with incomplete bladder emptying       Relevant Medications   finasteride (PROSCAR) 5 MG tablet  doxazosin (CARDURA) 4 MG tablet   Status post insertion of iliac artery stent       bilateral      Follow-up in approximately 6 months.    Jill Alexanders, MD

## 2022-07-24 ENCOUNTER — Other Ambulatory Visit: Payer: Self-pay

## 2022-07-24 ENCOUNTER — Telehealth: Payer: Self-pay

## 2022-07-24 DIAGNOSIS — I714 Abdominal aortic aneurysm, without rupture, unspecified: Secondary | ICD-10-CM

## 2022-07-24 NOTE — Telephone Encounter (Signed)
Called pt to advise form does not need A1C as he is not diabetic and vision test will need to be conducted by eye Dr. Abbott Pao did not answer. Could not leave message.

## 2022-08-06 ENCOUNTER — Encounter (HOSPITAL_BASED_OUTPATIENT_CLINIC_OR_DEPARTMENT_OTHER): Payer: Self-pay

## 2022-08-06 ENCOUNTER — Ambulatory Visit (HOSPITAL_BASED_OUTPATIENT_CLINIC_OR_DEPARTMENT_OTHER)
Admission: RE | Admit: 2022-08-06 | Discharge: 2022-08-06 | Disposition: A | Discharge status: Home / Self Care | Payer: Medicare Other | Source: Ambulatory Visit | Attending: Vascular Surgery | Admitting: Vascular Surgery

## 2022-08-06 DIAGNOSIS — I714 Abdominal aortic aneurysm, without rupture, unspecified: Secondary | ICD-10-CM | POA: Diagnosis not present

## 2022-08-06 DIAGNOSIS — I7143 Infrarenal abdominal aortic aneurysm, without rupture: Secondary | ICD-10-CM | POA: Diagnosis not present

## 2022-08-06 DIAGNOSIS — K449 Diaphragmatic hernia without obstruction or gangrene: Secondary | ICD-10-CM | POA: Diagnosis not present

## 2022-08-06 DIAGNOSIS — K8020 Calculus of gallbladder without cholecystitis without obstruction: Secondary | ICD-10-CM | POA: Diagnosis not present

## 2022-08-06 DIAGNOSIS — J9811 Atelectasis: Secondary | ICD-10-CM | POA: Diagnosis not present

## 2022-08-06 LAB — POCT I-STAT CREATININE: Creatinine, Ser: 0.8 mg/dL (ref 0.61–1.24)

## 2022-08-06 MED ORDER — IOHEXOL 350 MG/ML SOLN
100.0000 mL | Freq: Once | INTRAVENOUS | Status: AC | PRN
  Administered 2022-08-06: 80 mL via INTRAVENOUS

## 2022-08-13 ENCOUNTER — Ambulatory Visit (INDEPENDENT_AMBULATORY_CARE_PROVIDER_SITE_OTHER): Payer: Medicare Other | Admitting: Vascular Surgery

## 2022-08-13 ENCOUNTER — Encounter: Payer: Self-pay | Admitting: Vascular Surgery

## 2022-08-13 VITALS — BP 151/80 | HR 80 | Temp 98.3°F | Resp 20 | Ht 71.75 in | Wt 203.4 lb

## 2022-08-13 DIAGNOSIS — I7143 Infrarenal abdominal aortic aneurysm, without rupture: Secondary | ICD-10-CM

## 2022-08-13 NOTE — Progress Notes (Signed)
     Subjective:     Patient ID: Jacob Bowen, male   DOB: 1940/11/16, 82 y.o.   MRN: [id number]  HPI 82 year old male status post endovascular aneurysm repair for greater than 6 cm aneurysm with placement of bilateral external iliac artery stents for access.  He now has been walking without a limp previously had claudication.  Surgery was complicated by right groin cutdown which is now healed.  States that his blood pressure has been up otherwise recovering from surgery without issue remains on Plavix.   Review of Systems No complaints today    Objective:   Physical Exam Vitals:   08/13/22 1340  BP: (!) 151/80  Pulse: 80  Resp: 20  Temp: 98.3 F (36.8 C)  SpO2: 98%   Awake alert oriented Nonlabored respirations No palpable abdominal mass 2+ palpable comfortable pulses 1+ pitting edema bilateral ankles   IMPRESSION: VASCULAR IMPRESSION:   1. Post stent graft repair of infrarenal abdominal aortic aneurysm without evidence of endoleak. Very minimal amount of presumably postoperative periaortic stranding without evidence of contrast extravasation. 2. The now excluded native abdominal aortic aneurysm sac is unchanged in size compared to the 05/2022 examination measuring 5.2 cm in maximal diameter. 3. The now covered accessory left renal artery remains patent however again does not appear to contribute to an endoleak. 4. Expected interval occlusion involving the origin and proximal aspect of the IMA with early reconstitution via collateral supply from the SMA. 5. Interval stenting of the bilateral external iliac arteries without evidence of in stent stenosis or a residual hemodynamically significant narrowing. 6. Post open repair of the distal aspect of the right common femoral artery. 7. 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   NONVASCULAR IMPRESSION:   1. No acute  findings within the abdomen or pelvis. 2. Cholelithiasis without evidence of cholecystitis. 3. Prostatomegaly with diffuse thickening of the urinary bladder and an approximately 3 cm diverticulum arising from the posterolateral aspect of the urinary bladder wall, similar to the 02/2021 examination and as could be seen in the setting of bladder outlet obstruction.    Assessment:     82 year old male status post EVAR with bilateral extrailiac artery stenting for occlusive disease and need for cutdown of right common femoral artery now well-healed and progressing well since surgery    Plan:     Okay to resume normal activity  Patient will follow-up in 6 months given extrailiac artery stents.     Venancio Chenier C. Randie Heinz, MD [facility] [place] Office: [phone] Pager: [phone]

## 2022-08-18 ENCOUNTER — Other Ambulatory Visit: Payer: Self-pay

## 2022-08-18 DIAGNOSIS — I739 Peripheral vascular disease, unspecified: Secondary | ICD-10-CM

## 2022-08-18 DIAGNOSIS — I7143 Infrarenal abdominal aortic aneurysm, without rupture: Secondary | ICD-10-CM

## 2022-09-24 DIAGNOSIS — Z961 Presence of intraocular lens: Secondary | ICD-10-CM | POA: Diagnosis not present

## 2022-09-29 ENCOUNTER — Other Ambulatory Visit: Payer: Self-pay | Admitting: Physician Assistant

## 2022-12-29 ENCOUNTER — Other Ambulatory Visit: Payer: Self-pay | Admitting: Vascular Surgery

## 2022-12-31 ENCOUNTER — Other Ambulatory Visit: Payer: Self-pay | Admitting: Family Medicine

## 2022-12-31 DIAGNOSIS — N401 Enlarged prostate with lower urinary tract symptoms: Secondary | ICD-10-CM

## 2023-02-11 ENCOUNTER — Ambulatory Visit: Payer: Medicare Other | Admitting: Vascular Surgery

## 2023-02-11 ENCOUNTER — Encounter: Payer: Self-pay | Admitting: Vascular Surgery

## 2023-02-11 ENCOUNTER — Ambulatory Visit (HOSPITAL_COMMUNITY)
Admission: RE | Admit: 2023-02-11 | Discharge: 2023-02-11 | Disposition: A | Discharge status: Home / Self Care | Payer: Medicare Other | Source: Ambulatory Visit | Attending: Vascular Surgery | Admitting: Vascular Surgery

## 2023-02-11 ENCOUNTER — Ambulatory Visit (INDEPENDENT_AMBULATORY_CARE_PROVIDER_SITE_OTHER)
Admission: RE | Admit: 2023-02-11 | Discharge: 2023-02-11 | Discharge status: Home / Self Care | Payer: Medicare Other | Source: Ambulatory Visit | Attending: Vascular Surgery

## 2023-02-11 VITALS — BP 116/69 | HR 52 | Temp 97.9°F | Resp 20 | Ht 71.75 in | Wt 195.0 lb

## 2023-02-11 DIAGNOSIS — I714 Abdominal aortic aneurysm, without rupture, unspecified: Secondary | ICD-10-CM | POA: Diagnosis not present

## 2023-02-11 DIAGNOSIS — I7143 Infrarenal abdominal aortic aneurysm, without rupture: Secondary | ICD-10-CM | POA: Diagnosis not present

## 2023-02-11 DIAGNOSIS — I739 Peripheral vascular disease, unspecified: Secondary | ICD-10-CM | POA: Diagnosis not present

## 2023-02-11 DIAGNOSIS — I7409 Other arterial embolism and thrombosis of abdominal aorta: Secondary | ICD-10-CM | POA: Diagnosis not present

## 2023-02-11 LAB — VAS US ABI WITH/WO TBI
Left ABI: 0.74
Right ABI: 0.96

## 2023-02-11 NOTE — Progress Notes (Signed)
Patient ID: Jacob Bowen, male   DOB: 08-07-1940, 82 y.o.   MRN: [id number]  Reason for Consult: Follow-up   Referred by Ronnald Nian, MD  Subjective:     HPI:  Jacob Bowen is a 82 y.o. male \\history  of endovascular aneurysm repair for large aneurysm that required bilateral external iliac artery stents as well as well as cutting on the right common femoral artery for repair.  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  He does remain quite active he has no complaints related to today's visit and frankly denies claudication.  Past Medical History:  Diagnosis Date   AAA (abdominal aortic aneurysm) (HCC)    Femoral hernia, bilateral, s/p lap repair 04/13/2012   Hemorrhoids    EXTERNAL   Hypertension    Family History  Problem Relation Age of Onset   Hypertension Father    Past Surgical History:  Procedure Laterality Date   ABDOMINAL AORTIC ENDOVASCULAR STENT GRAFT N/A 07/03/2022   Procedure: ABDOMINAL AORTIC ENDOVASCULAR STENT GRAFT;  Surgeon: Maeola Harman, MD;  Location: [facility] OR;  Service: Vascular;  Laterality: N/A;   COLONOSCOPY     COLONOSCOPY W/ POLYPECTOMY     INGUINAL HERNIA REPAIR Right    INSERTION OF ILIAC STENT Bilateral 07/03/2022   Procedure: INSERTION OF ILIAC STENTS;  Surgeon: Maeola Harman, MD;  Location: [facility] OR;  Service: Vascular;  Laterality: Bilateral;   PATCH ANGIOPLASTY Right 07/03/2022   Procedure: PATCH ANGIOPLASTY RIGHT COMMON FEMORAL ARTERY;  Surgeon: Maeola Harman, MD;  Location[facility] OR;  Service: Vascular;  Laterality: Right;   THROMBECTOMY FEMORAL ARTERY Right 07/03/2022   Procedure: THROMBECTOMY FEMORAL ARTERY;  Surgeon: Maeola Harman, MD;  Location: [facility] OR;  Service: Vascular;  Laterality: Right;   ULTRASOUND GUIDANCE FOR VASCULAR ACCESS Bilateral 07/03/2022   Procedure: ULTRASOUND GUIDANCE FOR VASCULAR ACCESS;  Surgeon: Maeola Harman, MD;   Location: [facility] OR;  Service: Vascular;  Laterality: Bilateral;    Short Social History:  Social History   Tobacco Use   Smoking status: Never    Passive exposure: Never   Smokeless tobacco: Never  Substance Use Topics   Alcohol use: No    Comment: rare    No Known Allergies  Current Outpatient Medications  Medication Sig Dispense Refill   acetaminophen (TYLENOL) 325 MG tablet Take 650 mg by mouth every 6 (six) hours as needed for moderate pain.     aspirin EC 81 MG tablet Take 1 tablet (81 mg total) by mouth daily. Swallow whole. 90 tablet 3   clopidogrel (PLAVIX) 75 MG tablet TAKE 1 TABLET (75 MG TOTAL) BY MOUTH DAILY AT 6 (SIX) AM. 90 tablet 1   doxazosin (CARDURA) 4 MG tablet Take 1 tablet (4 mg total) by mouth daily. 90 tablet 3   finasteride (PROSCAR) 5 MG tablet TAKE 1 TABLET (5 MG TOTAL) BY MOUTH DAILY. 90 tablet 2   lactose free nutrition (BOOST) LIQD Take 237 mLs by mouth daily.     lisinopril-hydrochlorothiazide (ZESTORETIC) 10-12.5 MG tablet Take 1 tablet by mouth daily. 90 tablet 3   rosuvastatin (CRESTOR) 10 MG tablet Take 1 tablet (10 mg total) by mouth daily. 90 tablet 3   No current facility-administered medications for this visit.    Review of Systems  Constitutional:  Constitutional negative. HENT: HENT negative.  Eyes: Eyes negative.  Respiratory: Respiratory negative.  Cardiovascular: Cardiovascular negative.  GI: Gastrointestinal negative.  Musculoskeletal:  Musculoskeletal negative.  Skin: Skin negative.  Neurological: Neurological negative. Hematologic: Hematologic/lymphatic negative.  Psychiatric: Psychiatric negative.        Objective:  Objective   Vitals:   02/11/23 0827  BP: 116/69  Pulse: (!) 52  Resp: 20  Temp: 97.9 F (36.6 C)  SpO2: 96%  Weight: 195 lb (88.5 kg)  Height: 5' 11.75" (1.822 m)   Body mass index is 26.63 kg/m.  Physical Exam HENT:     Head: Normocephalic.     Nose: Nose normal.  Eyes:     Pupils: Pupils are  equal, round, and reactive to light.  Cardiovascular:     Rate and Rhythm: Normal rate.     Pulses:          Radial pulses are 2+ on the right side and 2+ on the left side.       Popliteal pulses are 3+ on the right side and 0 on the left side.       Dorsalis pedis pulses are 2+ on the right side and 0 on the left side.       Posterior tibial pulses are 0 on the left side.  Pulmonary:     Effort: Pulmonary effort is normal.  Abdominal:     General: Abdomen is flat.  Skin:    General: Skin is warm.     Capillary Refill: Capillary refill takes less than 2 seconds.  Neurological:     General: No focal deficit present.     Mental Status: He is alert.  Psychiatric:        Mood and Affect: Mood normal.        Thought Content: Thought content normal.        Judgment: Judgment normal.     Data: ABI Findings:  +---------+------------------+-----+--------+--------+  Right   Rt Pressure (mmHg)IndexWaveformComment   +---------+------------------+-----+--------+--------+  Brachial 113                                      +---------+------------------+-----+--------+--------+  PTA     106               0.94 biphasic          +---------+------------------+-----+--------+--------+  DP      109               0.96 biphasic          +---------+------------------+-----+--------+--------+  Great Toe72                0.64                   +---------+------------------+-----+--------+--------+   +---------+------------------+-----+--------+-------+  Left    Lt Pressure (mmHg)IndexWaveformComment  +---------+------------------+-----+--------+-------+  Brachial 113                                     +---------+------------------+-----+--------+-------+  PTA     84                0.74 biphasic         +---------+------------------+-----+--------+-------+  DP      69                0.61 biphasic          +---------+------------------+-----+--------+-------+  Lanell Persons  0.62                  +---------+------------------+-----+--------+-------+   +-------+-----------+-----------+------------+------------+  ABI/TBIToday's ABIToday's TBIPrevious ABIPrevious TBI  +-------+-----------+-----------+------------+------------+  Right 0.96       0.64                                 +-------+-----------+-----------+------------+------------+  Left  0.74       0.62                                 +-------+-----------+-----------+------------+------------+           Summary:  Right: Resting right ankle-brachial index is within normal range. The  right toe-brachial index is abnormal.   Left: Resting left ankle-brachial index indicates moderate left lower  extremity arterial disease. The left toe-brachial index is abnormal.    Abdominal Aorta Findings:  +-------------+-------+----------+----------+--------+--------+--------+  Location    AP (cm)Trans (cm)PSV (cm/s)WaveformThrombusComments  +-------------+-------+----------+----------+--------+--------+--------+  RT EIA Prox                   174                                 +-------------+-------+----------+----------+--------+--------+--------+  RT EIA Mid                    184                                 +-------------+-------+----------+----------+--------+--------+--------+  RT EIA Distal                 287                                 +-------------+-------+----------+----------+--------+--------+--------+  LT EIA Prox                   172                                 +-------------+-------+----------+----------+--------+--------+--------+  LT EIA Mid                    193                                 +-------------+-------+----------+----------+--------+--------+--------+  LT EIA Distal                 205                                  +-------------+-------+----------+----------+--------+--------+--------+     Endovascular Aortic Repair (EVAR):  +----------+----------------+-------------------+-------------------+           Diameter AP (cm)Diameter Trans (cm)Velocities (cm/sec)  +----------+----------------+-------------------+-------------------+  Aorta    4.77            4.84               82                   +----------+----------------+-------------------+-------------------+  Right Limb                                                        +----------+----------------+-------------------+-------------------+  Left Limb                                                         +----------+----------------+-------------------+-------------------+        Summary:  Abdominal Aorta: Patent endovascular aneurysm repair with no evidence of  endoleak. The largest aortic diameter has decreased compared to prior  exam. Previous diameter measurement was 5.2 cm obtained on 08/06/2022 CTA.       Assessment/Plan:    82 year old male status post EVAR with bilateral external iliac artery stenting for occlusive disease.  He is doing very well walking without limitation.  We have previously evaluated his popliteal arteries with duplex and the right side is not enlarged left side SFA is occluded.  No evidence of endoleak on today's exam we will follow-up in 6 months with repeat duplex and ABIs and if at that time he is progressing well can follow-up in 1 year.    Maeola Harman MD [facility]

## 2023-02-27 ENCOUNTER — Other Ambulatory Visit (HOSPITAL_BASED_OUTPATIENT_CLINIC_OR_DEPARTMENT_OTHER): Payer: Self-pay

## 2023-02-27 MED ORDER — INFLUENZA VAC A&B SURF ANT ADJ 0.5 ML IM SUSY
0.5000 mL | PREFILLED_SYRINGE | Freq: Once | INTRAMUSCULAR | 0 refills | Status: AC
  Filled 2023-02-27: qty 0.5, 1d supply, fill #0

## 2023-02-27 MED ORDER — COVID-19 MRNA VAC-TRIS(PFIZER) 30 MCG/0.3ML IM SUSY
0.3000 mL | PREFILLED_SYRINGE | Freq: Once | INTRAMUSCULAR | 0 refills | Status: AC
  Filled 2023-02-27: qty 0.3, 1d supply, fill #0

## 2023-03-02 ENCOUNTER — Other Ambulatory Visit (HOSPITAL_BASED_OUTPATIENT_CLINIC_OR_DEPARTMENT_OTHER): Payer: Self-pay

## 2023-03-06 ENCOUNTER — Other Ambulatory Visit: Payer: Self-pay

## 2023-03-06 DIAGNOSIS — I739 Peripheral vascular disease, unspecified: Secondary | ICD-10-CM

## 2023-03-06 DIAGNOSIS — I7409 Other arterial embolism and thrombosis of abdominal aorta: Secondary | ICD-10-CM

## 2023-04-07 IMAGING — CT CT CTA ABD/PEL W/CM AND/OR W/O CM
2 of 6 series · 15 of 46 positions shown, 17 images · IV contrast (OMNIPAQUE)
Comparison: Ultrasound 07/18/2020

CLINICAL DATA: Abdominal aortic aneurysm surveillance

EXAM:
CTA ABDOMEN AND PELVIS WITH CONTRAST
TECHNIQUE: Multidetector CT imaging of the abdomen and pelvis was performed
using the standard protocol during bolus administration of
intravenous contrast. Multiplanar reconstructed images and MIPs were
obtained and reviewed to evaluate the vascular anatomy.
CONTRAST:  80mL OMNIPAQUE IOHEXOL 350 MG/ML SOLN

[Series 4: axial arterial · axial · arterial · 0.80mm/px · z∈[+402,+840]mm · 12 of 162 slices shown, 14 images]
[im 8/162  soft-tissue]
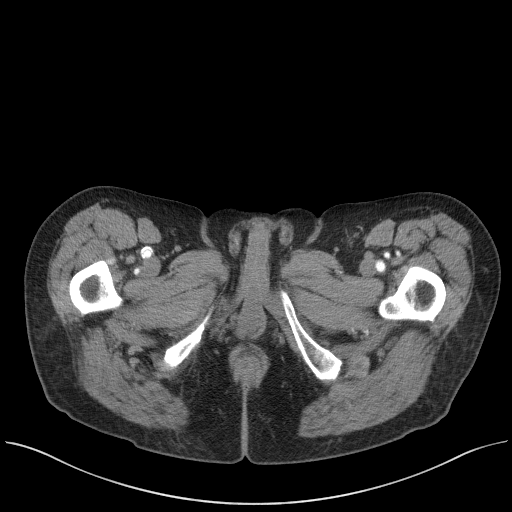
[im 8/162  bone]
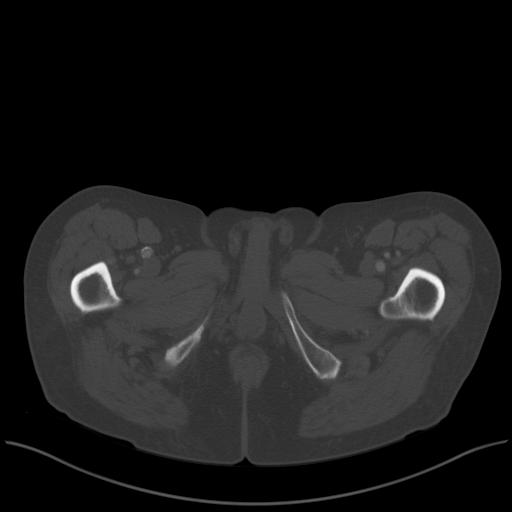
[im 22/162  soft-tissue]
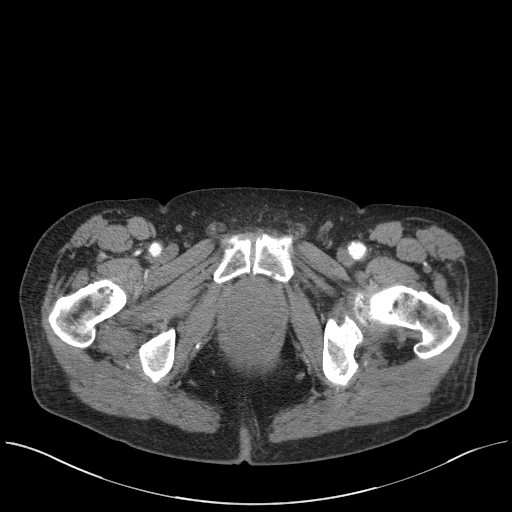
[im 37/162  soft-tissue]
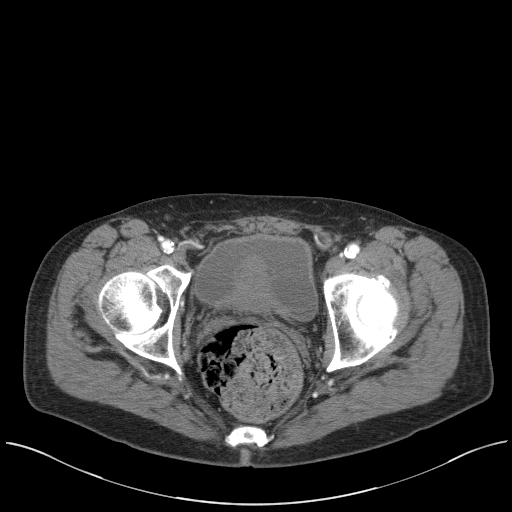
[im 52/162  soft-tissue]
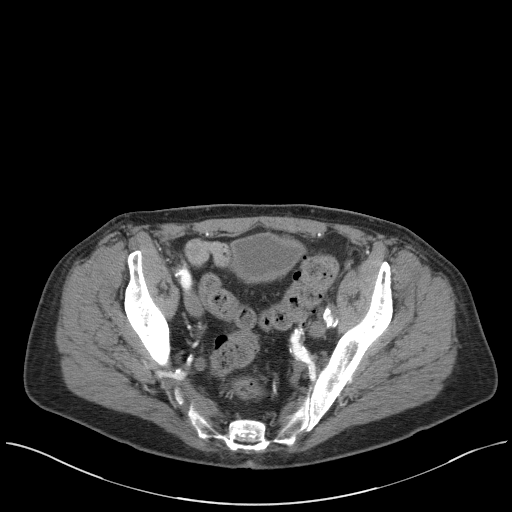
[im 59/162  soft-tissue]
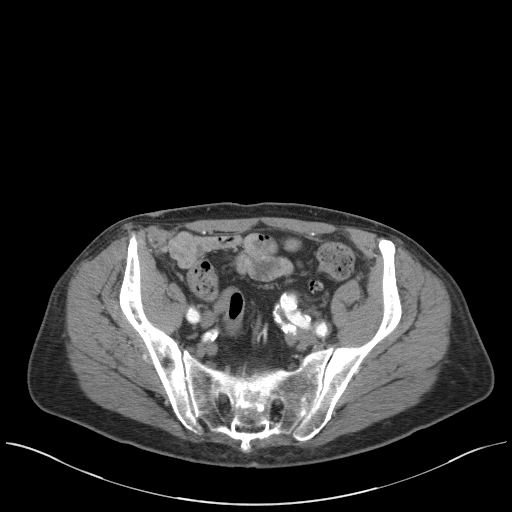
[im 74/162  soft-tissue]
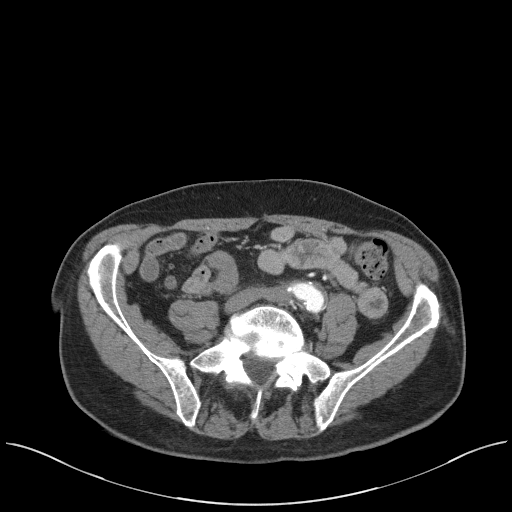
[im 88/162  soft-tissue]
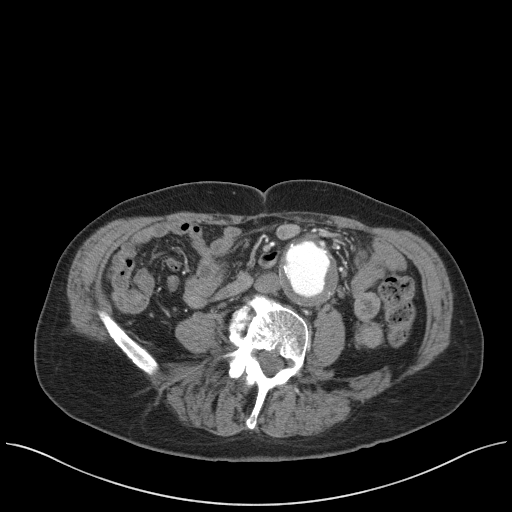
[im 103/162  soft-tissue]
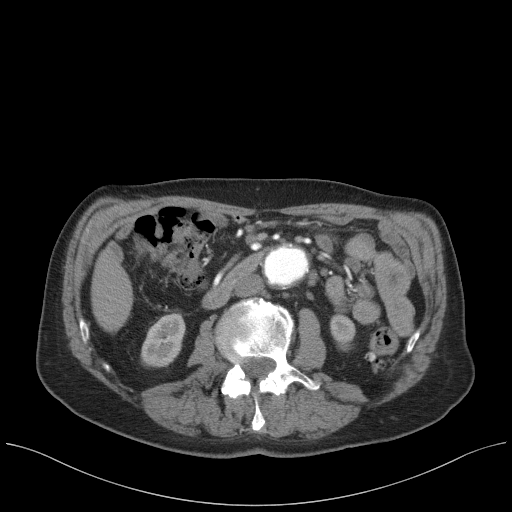
[im 110/162  soft-tissue]
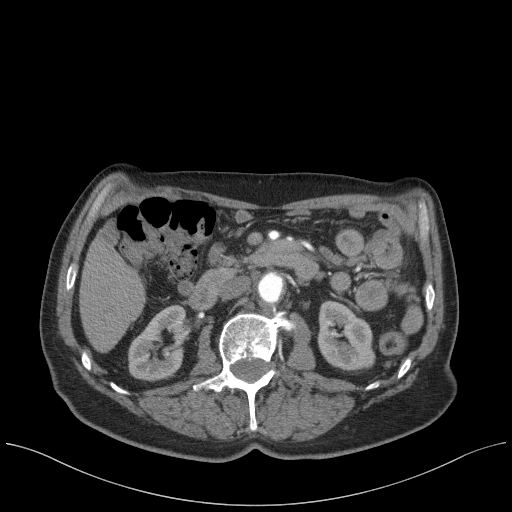
[im 110/162  bone]
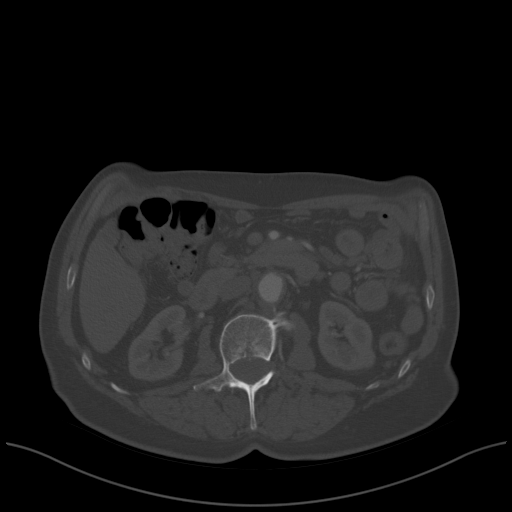
[im 125/162  soft-tissue]
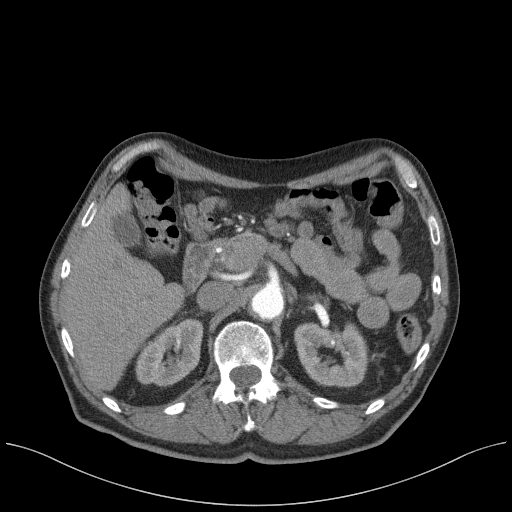
[im 140/162  soft-tissue]
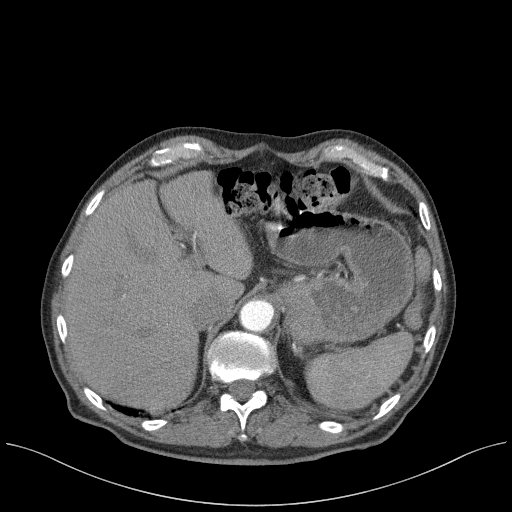
[im 154/162  soft-tissue]
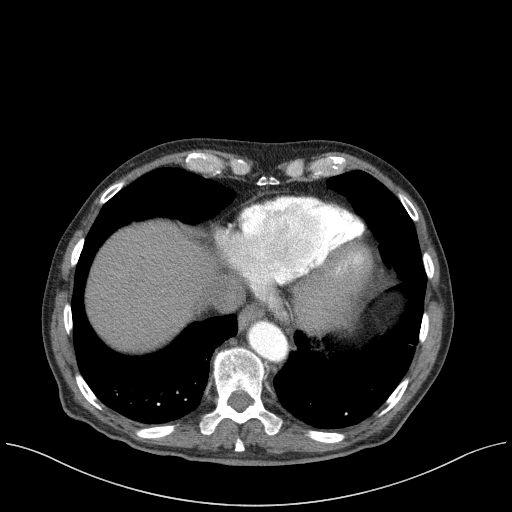

[Series 5: coronal · coronal · 0.90mm/px · 3 of 83 slices shown]
[im 21/83  soft-tissue]
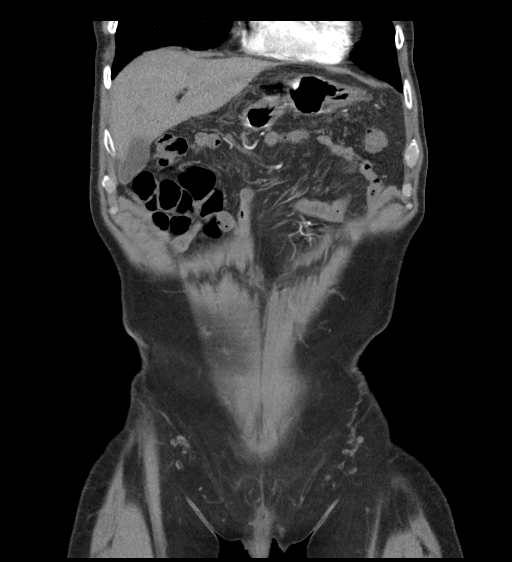
[im 42/83  soft-tissue]
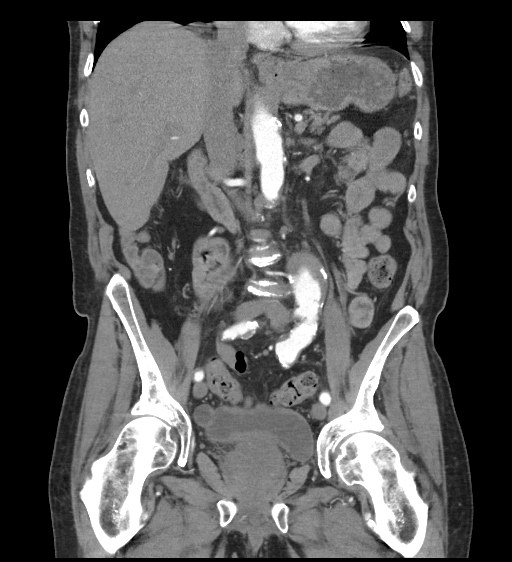
[im 62/83  soft-tissue]
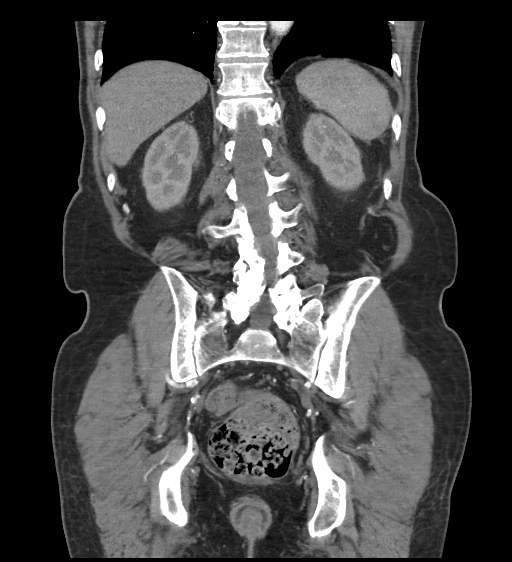

[15 of 46 positions shown; findings below may reference images not displayed]

FINDINGS: VASCULAR

Aorta: No dissection or stenosis. Partially calcified plaque in the
suprarenal and juxtarenal segments. Fusiform infrarenal aneurysm
cm maximum transverse diameter, containing some nonocclusive
eccentric mural thrombus

Celiac: Calcified ostial plaque resulting in only mild stenosis,
patent distally.

SMA: Scattered partially calcified nonocclusive atheromatous plaque.
Replaced hepatic arterial supply, an anatomic variant.

Renals: Duplicated left, superior dominant, with some scattered
proximal partially calcified plaque but no high-grade stenosis.
Single right, with partially calcified proximal plaque, no
high-grade stenosis, patent distally.

IMA: Patent, arising from the aneurysmal segment of the aorta.

Inflow: Extensive calcified plaque throughout the iliac arterial
system. Common iliac arteries are ectatic up to 1.6 cm right, 2.2 cm
left.

Distal right external iliac artery Short-segment stenosis of at
least mild severity.

Mid left external iliac artery Short-segment stenosis of at least
mild severity.

Distal left internal iliac artery Short-segment stenosis of at least
moderate severity.

Proximal Outflow: Scattered calcified atheromatous plaque. Proximal
occlusion of the left SFA.

Veins: No obvious venous abnormality within the limitations of this
arterial phase study.

Review of the MIP images confirms the above findings.

NON-VASCULAR

Lower chest: No pleural or pericardial effusion.

Hepatobiliary: Small partially calcified stones layer in the
dependent aspect of the nondilated gallbladder. No focal liver
lesion or biliary ductal dilatation.

Pancreas: Unremarkable. No pancreatic ductal dilatation or
surrounding inflammatory changes.

Spleen: Normal in size without focal abnormality.

Adrenals/Urinary Tract: Adrenal glands are unremarkable. Kidneys are
normal, without renal calculi, focal lesion, or hydronephrosis.
Bladder is incompletely distended, with a right lateral diverticulum
noted.

Stomach/Bowel: Stomach is incompletely distended, unremarkable.
Small bowel decompressed. Normal appendix. The colon is nondilated,
with scattered descending and proximal sigmoid diverticula; no
significant adjacent inflammatory change. Rectum is distended by
fecal material.

Lymphatic: No abdominal or pelvic adenopathy.

Reproductive: Prostate enlargement with central coarse
calcifications.

Other: Left pelvic phleboliths.  No ascites.  No free air.

Musculoskeletal: Lumbar levoscoliosis apex L4 with multilevel
spondylitic change. No fracture or worrisome bone lesion.
IMPRESSION: 1. 4.7 cm fusiform infrarenal abdominal Aortic aneurysm NOS
([id number]). Current guidelines recommend follow-up CT/MR every 6
months and vascular consultation(the patient is currently under the
care of a vascular specialist/surgeon).
2. Dilated 1.6 cm right and 2.2 cm left common iliac arteries.
3. Stenoses of bilateral external iliac and left internal iliac
arteries as above.
4. Proximal occlusion of the left SFA.
5. Colonic diverticulosis.

## 2023-05-29 ENCOUNTER — Other Ambulatory Visit: Payer: Self-pay | Admitting: Physician Assistant

## 2023-06-30 ENCOUNTER — Encounter: Payer: Self-pay | Admitting: Internal Medicine

## 2023-07-20 ENCOUNTER — Other Ambulatory Visit: Payer: Self-pay | Admitting: Family Medicine

## 2023-07-20 DIAGNOSIS — I10 Essential (primary) hypertension: Secondary | ICD-10-CM

## 2023-07-20 DIAGNOSIS — N401 Enlarged prostate with lower urinary tract symptoms: Secondary | ICD-10-CM

## 2023-07-27 ENCOUNTER — Other Ambulatory Visit: Payer: Self-pay | Admitting: Family Medicine

## 2023-07-27 DIAGNOSIS — N401 Enlarged prostate with lower urinary tract symptoms: Secondary | ICD-10-CM

## 2023-07-28 ENCOUNTER — Ambulatory Visit: Payer: Medicare Other

## 2023-07-28 DIAGNOSIS — Z0000 Encounter for general adult medical examination without abnormal findings: Secondary | ICD-10-CM

## 2023-07-28 NOTE — Patient Instructions (Addendum)
 Jacob Bowen , Thank you for taking time to come for your Medicare Wellness Visit. I appreciate your ongoing commitment to your health goals. Please review the following plan we discussed and let me know if I can assist you in the future.   Referrals/Orders/Follow-Ups/Clinician Recommendations: none  This is a list of the screening recommended for you and due dates:  Health Maintenance  Topic Date Due   COVID-19 Vaccine (8 - 2024-25 season) 08/28/2023   Medicare Annual Wellness Visit  07/27/2024   DTaP/Tdap/Td vaccine (3 - Td or Tdap) 07/09/2031   Flu Shot  Completed   Zoster (Shingles) Vaccine  Completed   HPV Vaccine  Aged Out   Pneumonia Vaccine  Discontinued   Colon Cancer Screening  Discontinued   Hepatitis C Screening  Discontinued    Advanced directives: (ACP Link)Information on Advanced Care Planning can be found at [facility] Advance Health Care Directives Advance Health Care Directives. [email or web address]   Next Medicare Annual Wellness Visit scheduled for next year: yes  insert Preventive Care attachment Insert FALL PREVENTION attachment if needed

## 2023-07-28 NOTE — Progress Notes (Signed)
 Subjective:   Jacob Bowen is a 83 y.o. who presents for a Medicare Wellness preventive visit.  Visit Complete: Virtual I connected with  Jacob Bowen on 07/28/23 by a audio enabled telemedicine application and verified that I am speaking with the correct person using two identifiers.  Patient Location: Home  Provider Location: Office/Clinic  I discussed the limitations of evaluation and management by telemedicine. The patient expressed understanding and agreed to proceed.  Vital Signs: Because this visit was a virtual/telehealth visit, some criteria may be missing or patient reported. Any vitals not documented were not able to be obtained and vitals that have been documented are patient reported.  VideoError- Librarian, academic were attempted between this provider and patient, however failed, due to patient having technical difficulties OR patient did not have access to video capability.  We continued and completed visit with audio only.   Persons Participating in Visit: Patient.  AWV Questionnaire: No: Patient Medicare AWV questionnaire was not completed prior to this visit.  Cardiac Risk Factors include: advanced age (>50men, >31 women);dyslipidemia;hypertension;male gender     Objective:    Today's Vitals   There is no height or weight on file to calculate BMI.     07/28/2023    2:21 PM 07/22/2022    2:24 PM 06/30/2022   11:07 AM 07/16/2021   11:09 AM 07/03/2020    4:06 PM 06/28/2019   10:22 AM 06/22/2017    2:03 PM  Advanced Directives  Does Patient Have a Medical Advance Directive? No No No No No No No  Would patient like information on creating a medical advance directive?    Yes (ED - Information included in AVS) Yes (MAU/Ambulatory/Procedural Areas - Information given) Yes (MAU/Ambulatory/Procedural Areas - Information given)     Current Medications (verified) Outpatient Encounter Medications as of 07/28/2023  Medication Sig    acetaminophen (TYLENOL) 325 MG tablet Take 650 mg by mouth every 6 (six) hours as needed for moderate pain.   clopidogrel (PLAVIX) 75 MG tablet TAKE 1 TABLET (75 MG TOTAL) BY MOUTH DAILY AT 6 (SIX) AM.   [facility] ASPIRIN LOW DOSE 81 MG tablet TAKE 1 TABLET (81 MG TOTAL) BY MOUTH DAILY. SWALLOW WHOLE.   doxazosin (CARDURA) 4 MG tablet TAKE 1 TABLET BY MOUTH EVERY DAY   finasteride (PROSCAR) 5 MG tablet TAKE 1 TABLET (5 MG TOTAL) BY MOUTH DAILY.   lactose free nutrition (BOOST) LIQD Take 237 mLs by mouth daily.   lisinopril-hydrochlorothiazide (ZESTORETIC) 10-12.5 MG tablet TAKE 1 TABLET BY MOUTH EVERY DAY   rosuvastatin (CRESTOR) 10 MG tablet Take 1 tablet (10 mg total) by mouth daily.   No facility-administered encounter medications on file as of 07/28/2023.    Allergies (verified) Patient has no known allergies.   History: Past Medical History:  Diagnosis Date   AAA (abdominal aortic aneurysm) (HCC)    Femoral hernia, bilateral, s/p lap repair 04/13/2012   Hemorrhoids    EXTERNAL   Hypertension    Past Surgical History:  Procedure Laterality Date   ABDOMINAL AORTIC ENDOVASCULAR STENT GRAFT N/A 07/03/2022   Procedure: ABDOMINAL AORTIC ENDOVASCULAR STENT GRAFT;  Surgeon: Maeola Harman, MD;  Location: [facility] OR;  Service: Vascular;  Laterality: N/A;   COLONOSCOPY     COLONOSCOPY W/ POLYPECTOMY     INGUINAL HERNIA REPAIR Right    INSERTION OF ILIAC STENT Bilateral 07/03/2022   Procedure: INSERTION OF ILIAC STENTS;  Surgeon: Maeola Harman, MD;  Location: [facility] OR;  Service: Vascular;  Laterality: Bilateral;   PATCH ANGIOPLASTY Right 07/03/2022   Procedure: PATCH ANGIOPLASTY RIGHT COMMON FEMORAL ARTERY;  Surgeon: Maeola Harman, MD;  Location: [facility] OR;  Service: Vascular;  Laterality: Right;   THROMBECTOMY FEMORAL ARTERY Right 07/03/2022   Procedure: THROMBECTOMY FEMORAL ARTERY;  Surgeon: Maeola Harman, MD;  Location: [facility] OR;  Service: Vascular;  Laterality:  Right;   ULTRASOUND GUIDANCE FOR VASCULAR ACCESS Bilateral 07/03/2022   Procedure: ULTRASOUND GUIDANCE FOR VASCULAR ACCESS;  Surgeon: Maeola Harman, MD;  Location: [facility] OR;  Service: Vascular;  Laterality: Bilateral;   Family History  Problem Relation Age of Onset   Hypertension Father    Social History   Socioeconomic History   Marital status: Divorced    Spouse name: Not on file   Number of children: Not on file   Years of education: Not on file   Highest education level: Not on file  Occupational History   Not on file  Tobacco Use   Smoking status: Never    Passive exposure: Never   Smokeless tobacco: Never  Vaping Use   Vaping status: Never Used  Substance and Sexual Activity   Alcohol use: No    Comment: rare   Drug use: Not Currently    Types: Marijuana   Sexual activity: Not Currently  Other Topics Concern   Not on file  Social History Narrative   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   Social Drivers of Corporate investment banker Strain: Low Risk  (07/28/2023)   Overall Financial Resource Strain (CARDIA)    Difficulty of Paying Living Expenses: Not hard at all  Food Insecurity: No Food Insecurity (07/28/2023)   Hunger Vital Sign    Worried About Running Out of Food in the Last Year: Never true    Ran Out of Food in the Last Year: Never true  Transportation Needs: No Transportation Needs (07/28/2023)   PRAPARE - Administrator, Civil Service (Medical): No    Lack of Transportation (Non-Medical): No  Physical Activity: Sufficiently Active (07/28/2023)   Exercise Vital Sign    Days of Exercise per Week: 5 days    Minutes of Exercise per Session: 30 min  Stress: No Stress Concern Present (07/28/2023)   [employer] of Occupational Health - Occupational Stress Questionnaire    Feeling of Stress : Not at all  Social Connections: Unknown (07/28/2023)   Social Connection and  Isolation Panel [NHANES]    Frequency of Communication with Friends and Family: Once a week    Frequency of Social Gatherings with Friends and Family: Not on file    Attends Religious Services: Never    Database administrator or Organizations: Yes    Attends Engineer, structural: More than 4 times per year    Marital Status: Divorced    Tobacco Counseling Counseling given: Not Answered    Clinical Intake:  Pre-visit preparation completed: Yes  Pain : No/denies pain     Nutritional Risks: None Diabetes: No  Lab Results  Component Value Date   HGBA1C 5.7 (H) 07/16/2021   HGBA1C 5.6 06/28/2019   HGBA1C 5.5 06/24/2018     How often do you need to have someone help you when you read instructions, pamphlets, or other written materials from your doctor or pharmacy?: 1 - Never  Interpreter Needed?: No  Information entered by :: NAllen LPN  Activities of Daily Living     07/28/2023    2:07 PM  In your present state of health, do you have any difficulty performing the following activities:  Hearing? 1  Comment deaf in left ear, has hearing aids  Vision? 0  Difficulty concentrating or making decisions? 0  Walking or climbing stairs? 0  Dressing or bathing? 0  Doing errands, shopping? 0  Preparing Food and eating ? N  Using the Toilet? N  In the past six months, have you accidently leaked urine? N  Do you have problems with loss of bowel control? N  Managing your Medications? N  Managing your Finances? N  Housekeeping or managing your Housekeeping? N    Patient Care Team: Ronnald Nian, MD as PCP - General (Family Medicine)  Indicate any recent Medical Services you may have received from other than [facility] providers in the past year (date may be approximate).     Assessment:   This is a routine wellness examination for Jacob Bowen.  Hearing/Vision screen Hearing Screening - Comments:: Deaf in left ear, wears hearing aids sometimes Vision Screening -  Comments:: Regular eye exams, [facility]   Goals Addressed             This Visit's Progress    Patient Stated       07/28/2023, maintain current health       Depression Screen     07/28/2023    2:23 PM 07/22/2022    2:25 PM 07/16/2021   11:14 AM 07/03/2020    4:06 PM 06/28/2019    9:50 AM 06/24/2018    9:27 AM 06/22/2017    1:43 PM  PHQ 2/9 Scores  PHQ - 2 Score 1 0 0 0 0 0 0  PHQ- 9 Score 1 1         Fall Risk     07/28/2023    2:22 PM 07/16/2022   12:20 PM 07/16/2021   11:10 AM 07/03/2020    4:06 PM 06/28/2019    9:50 AM  Fall Risk   Falls in the past year? 0 0 0 0 0  Number falls in past yr: 0 0 0 0   Injury with Fall? 0 0 0 0   Risk for fall due to : Medication side effect Medication side effect No Fall Risks No Fall Risks   Follow up Falls prevention discussed;Falls evaluation completed Falls prevention discussed;Education provided;Falls evaluation completed Falls evaluation completed Falls evaluation completed     MEDICARE RISK AT HOME:  Medicare Risk at Home Any stairs in or around the home?: No If so, are there any without handrails?: No Home free of loose throw rugs in walkways, pet beds, electrical cords, etc?: Yes Adequate lighting in your home to reduce risk of falls?: Yes Life alert?: No Use of a cane, walker or w/c?: No Grab bars in the bathroom?: No Shower chair or bench in shower?: Yes Elevated toilet seat or a handicapped toilet?: No  TIMED UP AND GO:  Was the test performed?  No  Cognitive Function: 6CIT completed        07/28/2023    2:25 PM 07/22/2022    2:30 PM  6CIT Screen  What Year? 0 points 0 points  What month? 0 points 0 points  What time? 0 points 0 points  Count back from 20 0 points 0 points  Months in reverse 0 points 0 points  Repeat phrase 0 points 2 points  Total Score 0  points 2 points    Immunizations Immunization History  Administered Date(s) Administered   DTaP 06/02/1994   Fluad Quad(high Dose 65+)  06/17/2021, 02/13/2022   Fluad Trivalent(High Dose 65+) 02/27/2023   Influenza Split 04/11/2011   Influenza Whole 05/17/2008, 05/18/2009   Influenza, High Dose Seasonal PF 05/08/2014, 05/21/2015, 06/06/2016, 05/24/2018   Influenza-Unspecified 06/20/2017, 05/24/2018   PFIZER Comirnaty(Gray Top)Covid-19 Tri-Sucrose Vaccine 11/14/2020   PFIZER(Purple Top)SARS-COV-2 Vaccination 06/30/2019, 07/26/2019, 03/03/2020   Pfizer Covid-19 Vaccine Bivalent Booster 9yrs & up 03/20/2021   Pfizer(Comirnaty)Fall Seasonal Vaccine 12 years and older 02/13/2022, 02/27/2023   Pneumococcal Conjugate-13 05/08/2014   Pneumococcal Polysaccharide-23 05/20/2006   Tdap 07/08/2021   Zoster Recombinant(Shingrix) 12/28/2019, 03/16/2020   Zoster, Live 05/20/2006    Screening Tests Health Maintenance  Topic Date Due   COVID-19 Vaccine (8 - 2024-25 season) 08/28/2023   Medicare Annual Wellness (AWV)  07/27/2024   DTaP/Tdap/Td (3 - Td or Tdap) 07/09/2031   INFLUENZA VACCINE  Completed   Zoster Vaccines- Shingrix  Completed   HPV VACCINES  Aged Out   Pneumonia Vaccine 36+ Years old  Discontinued   Colonoscopy  Discontinued   Hepatitis C Screening  Discontinued    Health Maintenance  There are no preventive care reminders to display for this patient.  Health Maintenance Items Addressed: Up to date  Additional Screening:  Vision Screening: Recommended annual ophthalmology exams for early detection of glaucoma and other disorders of the eye.  Dental Screening: Recommended annual dental exams for proper oral hygiene  Community Resource Referral / Chronic Care Management: CRR required this visit?  No   CCM required this visit?  No     Plan:     I have personally reviewed and noted the following in the patient's chart:   Medical and social history Use of alcohol, tobacco or illicit drugs  Current medications and supplements including opioid prescriptions. Patient is not currently taking opioid  prescriptions. Functional ability and status Nutritional status Physical activity Advanced directives List of other physicians Hospitalizations, surgeries, and ER visits in previous 12 months Vitals Screenings to include cognitive, depression, and falls Referrals and appointments  In addition, I have reviewed and discussed with patient certain preventive protocols, quality metrics, and best practice recommendations. A written personalized care plan for preventive services as well as general preventive health recommendations were provided to patient.     Barb Merino, LPN   [id number]   After Visit Summary: (MyChart) Due to this being a telephonic visit, the after visit summary with patients personalized plan was offered to patient via MyChart   Notes: Nothing significant to report at this time.

## 2023-08-02 ENCOUNTER — Other Ambulatory Visit: Payer: Self-pay | Admitting: Family Medicine

## 2023-08-02 DIAGNOSIS — E785 Hyperlipidemia, unspecified: Secondary | ICD-10-CM

## 2023-08-04 ENCOUNTER — Ambulatory Visit (INDEPENDENT_AMBULATORY_CARE_PROVIDER_SITE_OTHER): Payer: Medicare Other | Admitting: Family Medicine

## 2023-08-04 ENCOUNTER — Encounter: Payer: Self-pay | Admitting: Family Medicine

## 2023-08-04 VITALS — BP 124/78 | HR 84 | Ht 71.75 in | Wt 196.2 lb

## 2023-08-04 DIAGNOSIS — Z8679 Personal history of other diseases of the circulatory system: Secondary | ICD-10-CM

## 2023-08-04 DIAGNOSIS — K5904 Chronic idiopathic constipation: Secondary | ICD-10-CM

## 2023-08-04 DIAGNOSIS — N400 Enlarged prostate without lower urinary tract symptoms: Secondary | ICD-10-CM

## 2023-08-04 DIAGNOSIS — R3914 Feeling of incomplete bladder emptying: Secondary | ICD-10-CM | POA: Diagnosis not present

## 2023-08-04 DIAGNOSIS — H9113 Presbycusis, bilateral: Secondary | ICD-10-CM | POA: Diagnosis not present

## 2023-08-04 DIAGNOSIS — E785 Hyperlipidemia, unspecified: Secondary | ICD-10-CM | POA: Diagnosis not present

## 2023-08-04 DIAGNOSIS — N401 Enlarged prostate with lower urinary tract symptoms: Secondary | ICD-10-CM

## 2023-08-04 DIAGNOSIS — Z23 Encounter for immunization: Secondary | ICD-10-CM | POA: Diagnosis not present

## 2023-08-04 DIAGNOSIS — R972 Elevated prostate specific antigen [PSA]: Secondary | ICD-10-CM

## 2023-08-04 DIAGNOSIS — Z0000 Encounter for general adult medical examination without abnormal findings: Secondary | ICD-10-CM

## 2023-08-04 DIAGNOSIS — Z95828 Presence of other vascular implants and grafts: Secondary | ICD-10-CM

## 2023-08-04 DIAGNOSIS — I10 Essential (primary) hypertension: Secondary | ICD-10-CM | POA: Diagnosis not present

## 2023-08-04 LAB — LIPID PANEL

## 2023-08-04 MED ORDER — ROSUVASTATIN CALCIUM 10 MG PO TABS
10.0000 mg | ORAL_TABLET | Freq: Every day | ORAL | 3 refills | Status: AC
Start: 2023-08-04 — End: ?

## 2023-08-04 MED ORDER — LISINOPRIL-HYDROCHLOROTHIAZIDE 10-12.5 MG PO TABS: 1.0000 | ORAL_TABLET | Freq: Every day | ORAL | 3 refills | Status: AC

## 2023-08-04 MED ORDER — DOXAZOSIN MESYLATE 4 MG PO TABS: 4.0000 mg | ORAL_TABLET | Freq: Every day | ORAL | 3 refills | Status: AC

## 2023-08-04 MED ORDER — CLOPIDOGREL BISULFATE 75 MG PO TABS: 75.0000 mg | ORAL_TABLET | Freq: Every day | ORAL | 3 refills | Status: DC

## 2023-08-04 MED ORDER — FINASTERIDE 5 MG PO TABS: 5.0000 mg | ORAL_TABLET | Freq: Every day | ORAL | 3 refills | Status: AC

## 2023-08-04 NOTE — Progress Notes (Signed)
 Complete physical exam  Patient: Jacob Bowen   DOB: 1940-11-15   83 y.o. Male  MRN: [id number]  Subjective:    Chief Complaint  Patient presents with   Annual Exam    Fasting annual exam, no new concerns. Ok for [employer]. Has AWV w/nurse 07/28/23.    Jacob Bowen is a 83 y.o. male who presents today for a complete physical exam.  He reports consuming a general diet.  No routine exercise.  He generally feels well. He reports sleeping poorly.  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  He continues on his finasteride.  He did have an elevated PSA but we have decided to not pursue this.  He continues on Cardura as well as lisinopril/HCTZ.  Crestor is causing no difficulty.  He is on Plavix but was started after he had aneurysm repair.  He does wear hearing aids but did not wear them today.  At the end of the encounter he then discussed the fact that he has had difficulty with constipation having less stools and they came out quite hard and large in size.  He is concerned about cancer and explained that he symptoms against colon cancer  Most recent fall risk assessment:    07/28/2023    2:22 PM  Fall Risk   Falls in the past year? 0  Number falls in past yr: 0  Injury with Fall? 0  Risk for fall due to : Medication side effect  Follow up Falls prevention discussed;Falls evaluation completed     Most recent depression screenings:    07/28/2023    2:23 PM 07/22/2022    2:25 PM  PHQ 2/9 Scores  PHQ - 2 Score 1 0  PHQ- 9 Score 1 1    Vision:Within last year    Immunization History  Administered Date(s) Administered   DTaP 06/02/1994   Fluad Quad(high Dose 65+) 06/17/2021, 02/13/2022   Fluad Trivalent(High Dose 65+) 02/27/2023   Influenza Split 04/11/2011   Influenza Whole 05/17/2008, 05/18/2009   Influenza, High Dose Seasonal PF 05/08/2014, 05/21/2015, 06/06/2016, 05/24/2018    Influenza-Unspecified 06/20/2017, 05/24/2018   PFIZER Comirnaty(Gray Top)Covid-19 Tri-Sucrose Vaccine 11/14/2020   PFIZER(Purple Top)SARS-COV-2 Vaccination 06/30/2019, 07/26/2019, 03/03/2020   Pfizer Covid-19 Vaccine Bivalent Booster 25yrs & up 03/20/2021   Pfizer(Comirnaty)Fall Seasonal Vaccine 12 years and older 02/13/2022, 02/27/2023   Pneumococcal Conjugate-13 05/08/2014   Pneumococcal Polysaccharide-23 05/20/2006   Tdap 07/08/2021   Zoster Recombinant(Shingrix) 12/28/2019, 03/16/2020   Zoster, Live 05/20/2006    Health Maintenance  Topic Date Due   COVID-19 Vaccine (8 - 2024-25 season) 08/28/2023   INFLUENZA VACCINE  12/04/2023   Medicare Annual Wellness (AWV)  07/27/2024   DTaP/Tdap/Td (3 - Td or Tdap) 07/09/2031   Zoster Vaccines- Shingrix  Completed   HPV VACCINES  Aged Out   Pneumonia Vaccine 52+ Years old  Discontinued   Colonoscopy  Discontinued   Hepatitis C Screening  Discontinued    Patient Care Team: Ronnald Nian, MD as PCP - General (Family Medicine)  Optho:Dr. Fabian Sharp Dentist: Dr. Shelda Altes Card: Dr. Dietrich Pates ENT: Ezzard Standing (retired)  Outpatient Medications Prior to Visit  Medication Sig Note   clopidogrel (PLAVIX) 75 MG tablet TAKE 1 TABLET (75 MG TOTAL) BY MOUTH DAILY AT 6 (SIX) AM.    CVS ASPIRIN LOW DOSE 81 MG tablet TAKE 1 TABLET (81 MG TOTAL) BY  MOUTH DAILY. SWALLOW WHOLE.    doxazosin (CARDURA) 4 MG tablet TAKE 1 TABLET BY MOUTH EVERY DAY    finasteride (PROSCAR) 5 MG tablet TAKE 1 TABLET (5 MG TOTAL) BY MOUTH DAILY.    lactose free nutrition (BOOST) LIQD Take 237 mLs by mouth daily. 08/04/2023: Every other day   lisinopril-hydrochlorothiazide (ZESTORETIC) 10-12.5 MG tablet TAKE 1 TABLET BY MOUTH EVERY DAY    rosuvastatin (CRESTOR) 10 MG tablet TAKE 1 TABLET BY MOUTH EVERY DAY    [DISCONTINUED] acetaminophen (TYLENOL) 325 MG tablet Take 650 mg by mouth every 6 (six) hours as needed for moderate pain. (Patient not taking: Reported on 08/04/2023)     No facility-administered medications prior to visit.    Review of Systems  All other systems reviewed and are negative.   Family and social history as well as health maintenance and immunizations was reviewed.     Objective:    BP 124/78   Pulse 84   Ht 5' 11.75" (1.822 m)   Wt 196 lb 3.2 oz (89 kg) Comment: 190 at home per patient this am  SpO2 96%   BMI 26.80 kg/m    Physical Exam   No results found for any visits on 08/04/23.     Assessment & Plan:    Discussed health benefits of physical activity, and encouraged him to engage in regular exercise appropriate for his age and condition.  Routine general medical examination at a health care facility  Benign prostatic hyperplasia without lower urinary tract symptoms  Elevated PSA  Hyperlipidemia LDL goal <130  S/P AAA repair using bifurcation graft - Plan: Lipid panel  Presbycusis of both ears  Primary hypertension - Plan: CBC with Differential/Platelet, Comprehensive metabolic panel with GFR  Need for vaccination against Streptococcus pneumoniae - Plan: Pneumococcal conjugate vaccine 20-valent (Prevnar 20)  Chronic idiopathic constipation   Return in about 1 year (around 08/03/2024).      Sharlot Gowda, MD

## 2023-08-04 NOTE — Patient Instructions (Signed)
 Use MiraLAX on a regular basis.  You can also try Colace if you need to

## 2023-08-05 ENCOUNTER — Encounter: Payer: Self-pay | Admitting: Family Medicine

## 2023-08-05 LAB — CBC WITH DIFFERENTIAL/PLATELET
Basophils Absolute: 0 10*3/uL (ref 0.0–0.2)
Basos: 0 %
EOS (ABSOLUTE): 0.1 10*3/uL (ref 0.0–0.4)
Eos: 1 %
Hematocrit: 44.4 % (ref 37.5–51.0)
Hemoglobin: 14.7 g/dL (ref 13.0–17.7)
Immature Grans (Abs): 0 10*3/uL (ref 0.0–0.1)
Immature Granulocytes: 0 %
Lymphocytes Absolute: 1.3 10*3/uL (ref 0.7–3.1)
Lymphs: 22 %
MCH: 30.6 pg (ref 26.6–33.0)
MCHC: 33.1 g/dL (ref 31.5–35.7)
MCV: 93 fL (ref 79–97)
Monocytes Absolute: 0.5 10*3/uL (ref 0.1–0.9)
Monocytes: 9 %
Neutrophils Absolute: 3.9 10*3/uL (ref 1.4–7.0)
Neutrophils: 68 %
Platelets: 189 10*3/uL (ref 150–450)
RBC: 4.8 x10E6/uL (ref 4.14–5.80)
RDW: 12.3 % (ref 11.6–15.4)
WBC: 5.9 10*3/uL (ref 3.4–10.8)

## 2023-08-05 LAB — COMPREHENSIVE METABOLIC PANEL WITH GFR
ALT: 14 IU/L (ref 0–44)
AST: 17 IU/L (ref 0–40)
Albumin: 4.5 g/dL (ref 3.8–4.8)
Alkaline Phosphatase: 86 IU/L (ref 44–121)
BUN/Creatinine Ratio: 16 (ref 10–24)
BUN: 12 mg/dL (ref 8–27)
Bilirubin Total: 0.4 mg/dL (ref 0.0–1.2)
CO2: 21 mmol/L (ref 20–29)
Calcium: 9.5 mg/dL (ref 8.6–10.2)
Chloride: 105 mmol/L (ref 96–106)
Creatinine, Ser: 0.75 mg/dL — ABNORMAL LOW (ref 0.76–1.27)
Globulin, Total: 2.5 g/dL (ref 1.5–4.5)
Glucose: 110 mg/dL — ABNORMAL HIGH (ref 70–99)
Potassium: 3.5 mmol/L (ref 3.5–5.2)
Sodium: 142 mmol/L (ref 134–144)
Total Protein: 7 g/dL (ref 6.0–8.5)
eGFR: 91 mL/min/{1.73_m2} (ref >59)

## 2023-08-05 LAB — LIPID PANEL
Cholesterol, Total: 136 mg/dL (ref 100–199)
HDL: 47 mg/dL (ref >39)
LDL CALC COMMENT:: 2.9 ratio (ref 0.0–5.0)
LDL Chol Calc (NIH): 67 mg/dL (ref 0–99)
Triglycerides: 124 mg/dL (ref 0–149)
VLDL Cholesterol Cal: 22 mg/dL (ref 5–40)

## 2023-08-12 ENCOUNTER — Ambulatory Visit (INDEPENDENT_AMBULATORY_CARE_PROVIDER_SITE_OTHER)
Admission: RE | Admit: 2023-08-12 | Discharge: 2023-08-12 | Disposition: A | Discharge status: Home / Self Care | Payer: Medicare Other | Source: Ambulatory Visit | Attending: Vascular Surgery | Admitting: Vascular Surgery

## 2023-08-12 ENCOUNTER — Ambulatory Visit: Payer: Medicare Other | Admitting: Physician Assistant

## 2023-08-12 ENCOUNTER — Ambulatory Visit (HOSPITAL_COMMUNITY)
Admission: RE | Admit: 2023-08-12 | Discharge: 2023-08-12 | Disposition: A | Discharge status: Home / Self Care | Payer: Medicare Other | Source: Ambulatory Visit | Attending: Vascular Surgery | Admitting: Vascular Surgery

## 2023-08-12 VITALS — BP 114/58 | HR 66 | Temp 98.1°F | Ht 71.0 in | Wt 196.7 lb

## 2023-08-12 DIAGNOSIS — I7409 Other arterial embolism and thrombosis of abdominal aorta: Secondary | ICD-10-CM

## 2023-08-12 DIAGNOSIS — I739 Peripheral vascular disease, unspecified: Secondary | ICD-10-CM

## 2023-08-12 DIAGNOSIS — I714 Abdominal aortic aneurysm, without rupture, unspecified: Secondary | ICD-10-CM | POA: Diagnosis not present

## 2023-08-12 LAB — VAS US ABI WITH/WO TBI
Left ABI: 0.85
Right ABI: 1.06

## 2023-08-14 NOTE — Progress Notes (Signed)
 Office Note   History of Present Illness   Jacob Bowen is a 83 y.o. (04-02-1941) male who presents for surveillance of PAD.  He has a history of EVAR with bilateral external iliac artery stenting and right common femoral artery cutdown on 07/03/2022 by Dr. Randie Heinz.  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  He returns today for follow-up.  He says that he is doing well without any new changes to his medical history.  He denies any rest pain, claudication, or tissue loss.  He denies any abdominal or back pain.  Current Outpatient Medications  Medication Sig Dispense Refill   clopidogrel (PLAVIX) 75 MG tablet Take 1 tablet (75 mg total) by mouth daily at 6 (six) AM. 90 tablet 3   CVS ASPIRIN LOW DOSE 81 MG tablet TAKE 1 TABLET (81 MG TOTAL) BY MOUTH DAILY. SWALLOW WHOLE. 90 tablet 3   doxazosin (CARDURA) 4 MG tablet Take 1 tablet (4 mg total) by mouth daily. 90 tablet 3   finasteride (PROSCAR) 5 MG tablet Take 1 tablet (5 mg total) by mouth daily. 90 tablet 3   lactose free nutrition (BOOST) LIQD Take 237 mLs by mouth daily.     lisinopril-hydrochlorothiazide (ZESTORETIC) 10-12.5 MG tablet Take 1 tablet by mouth daily. 90 tablet 3   rosuvastatin (CRESTOR) 10 MG tablet Take 1 tablet (10 mg total) by mouth daily. 90 tablet 3   No current facility-administered medications for this visit.    REVIEW OF SYSTEMS (negative unless checked):   Cardiac:  []  Chest pain or chest pressure? []  Shortness of breath upon activity? []  Shortness of breath when lying flat? []  Irregular heart rhythm?  Vascular:  []  Pain in calf, thigh, or hip brought on by walking? []  Pain in feet at night that wakes you up from your sleep? []  Blood clot in your veins? []  Leg swelling?  Pulmonary:  []  Oxygen at home? []  Productive cough? []  Wheezing?  Neurologic:  []  Sudden weakness in arms or legs? []  Sudden numbness in arms or legs? []  Sudden onset of difficult speaking or slurred  speech? []  Temporary loss of vision in one eye? []  Problems with dizziness?  Gastrointestinal:  []  Blood in stool? []  Vomited blood?  Genitourinary:  []  Burning when urinating? []  Blood in urine?  Psychiatric:  []  Major depression  Hematologic:  []  Bleeding problems? []  Problems with blood clotting?  Dermatologic:  []  Rashes or ulcers?  Constitutional:  []  Fever or chills?  Ear/Nose/Throat:  []  Change in hearing? []  Nose bleeds? []  Sore throat?  Musculoskeletal:  []  Back pain? []  Joint pain? []  Muscle pain?   Physical Examination   Vitals:   08/12/23 0948  BP: (!) 114/58  Pulse: 66  Temp: 98.1 F (36.7 C)  TempSrc: Temporal  SpO2: 94%  Weight: 196 lb 11.2 oz (89.2 kg)  Height: 5\' 11"  (1.803 m)   Body mass index is 27.43 kg/m.  General:  WDWN in NAD; vital signs documented above Gait: Not observed HENT: WNL, normocephalic Pulmonary: normal non-labored breathing , without rales, rhonchi,  wheezing Cardiac: regular Abdomen: soft, NT, no masses Skin: without rashes Vascular Exam/Pulses: 2+ right DP pulse, nonpalpable left pedal pulses Extremities: without ischemic changes, without gangrene , without cellulitis; without open wounds;  Musculoskeletal: no muscle wasting or atrophy  Neurologic: A&O X 3;  No focal weakness or paresthesias are detected Psychiatric:  The pt has Normal affect.  Non-Invasive Vascular imaging   ABI (08/12/2023) R:  ABI: 1.06 (0.96),  PT: bi DP: bi TBI: 0.59 L:  ABI: 0.85 (0.74),  PT: bi DP: bi TBI: 0.52  Aortoiliac Duplex (08/12/2023) Patent bilateral external iliac artery stents without stenosis  Medical Decision Making   JAVONNE DORKO is a 83 y.o. male who presents for surveillance of PAD  Based on the patient's vascular studies, his ABIs appear improved bilaterally.  His right ABI is 1.06 and left ABI 0.85 Aortoiliac duplex demonstrates patent bilateral external iliac artery stents without stenosis He denies  any rest pain, claudication, or tissue loss.  He denies any abdominal or back pain. On exam he has a palpable right DP pulse.  He has nonpalpable pedal pulses on the left He can follow-up with our office in 1 year with repeat aortoiliac duplex and ABIs  Loel Dubonnet PA-C [facility] Office: [phone]  Clinic MD: Randie Heinz

## 2023-09-05 ENCOUNTER — Ambulatory Visit (HOSPITAL_COMMUNITY)
Admission: EM | Admit: 2023-09-05 | Discharge: 2023-09-05 | Disposition: A | Discharge status: Home / Self Care | Attending: Physician Assistant | Admitting: Physician Assistant

## 2023-09-05 ENCOUNTER — Encounter (HOSPITAL_COMMUNITY): Payer: Self-pay

## 2023-09-05 DIAGNOSIS — R319 Hematuria, unspecified: Secondary | ICD-10-CM | POA: Diagnosis not present

## 2023-09-05 DIAGNOSIS — N390 Urinary tract infection, site not specified: Secondary | ICD-10-CM | POA: Diagnosis not present

## 2023-09-05 HISTORY — DX: Pure hypercholesterolemia, unspecified: E78.00

## 2023-09-05 LAB — POCT URINALYSIS DIP (MANUAL ENTRY)
Glucose, UA: 100 mg/dL — AB
Nitrite, UA: NEGATIVE
Protein Ur, POC: >=300 mg/dL — AB
Spec Grav, UA: 1.015 (ref 1.010–1.025)
Urobilinogen, UA: 4 U/dL — AB
pH, UA: 5.5 (ref 5.0–8.0)

## 2023-09-05 MED ORDER — SULFAMETHOXAZOLE-TRIMETHOPRIM 800-160 MG PO TABS: 1.0000 | ORAL_TABLET | Freq: Two times a day (BID) | ORAL | 0 refills | Status: AC

## 2023-09-05 NOTE — ED Triage Notes (Addendum)
 Patient states he went to work yesterday [place] race [place] and then slept in his truck. Patient states he voided this AM and "had a steady stream of blood when urinating."    Patient is currently taking Plavix .

## 2023-09-05 NOTE — ED Provider Notes (Addendum)
 [facility]    CSN: [id number] Arrival date & time: 09/05/23  1014      History   Chief Complaint Chief Complaint  Patient presents with   Hematuria    HPI Jacob Bowen is a 83 y.o. male.   Patient presents with hematuria that started this morning.  He denies abdominal pain, dysuria, fever, chills, nausea, vomiting.  He is currently on Plavix , prescribed by vascular surgeon for AAA repair.  Per history patient has BPH and elevated PSA.  He is a non-smoker.  Reports he has not been followed by urology.    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   Benign prostatic hyperplasia without lower urinary tract symptoms 06/06/2016   FH: colonic polyps 06/06/2016   Hyperlipidemia LDL goal <130 05/08/2014   Elevated PSA 04/11/2011   Hypertension 04/11/2011   Hemorrhoids 04/11/2011    Past Surgical History:  Procedure Laterality Date   ABDOMINAL AORTIC ENDOVASCULAR STENT GRAFT N/A 07/03/2022   Procedure: ABDOMINAL AORTIC ENDOVASCULAR STENT GRAFT;  Surgeon: Adine Hoof, MD;  Location: [facility] OR;  Service: Vascular;  Laterality: N/A;   COLONOSCOPY     COLONOSCOPY W/ POLYPECTOMY     INGUINAL HERNIA REPAIR Right    INSERTION OF ILIAC STENT Bilateral 07/03/2022   Procedure: INSERTION OF ILIAC STENTS;  Surgeon: Adine Hoof, MD;  Location: [facility] OR;  Service: Vascular;  Laterality: Bilateral;   PATCH ANGIOPLASTY Right 07/03/2022   Procedure: PATCH ANGIOPLASTY RIGHT COMMON FEMORAL ARTERY;  Surgeon: Adine Hoof, MD;  Location: [facility] OR;  Service: Vascular;  Laterality: Right;   THROMBECTOMY FEMORAL ARTERY Right 07/03/2022   Procedure: THROMBECTOMY  FEMORAL ARTERY;  Surgeon: Adine Hoof, MD;  Location: [facility] OR;  Service: Vascular;  Laterality: Right;   ULTRASOUND GUIDANCE FOR VASCULAR ACCESS Bilateral 07/03/2022   Procedure: ULTRASOUND GUIDANCE FOR VASCULAR ACCESS;  Surgeon: Adine Hoof, MD;  Location: [facility] OR;  Service: Vascular;  Laterality: Bilateral;       Home Medications    Prior to Admission medications   Medication Sig Start Date End Date Taking? Authorizing Provider  sulfamethoxazole-trimethoprim (BACTRIM DS) 800-160 MG tablet Take 1 tablet by mouth 2 (two) times daily for 7 days. 09/05/23 09/12/23 Yes Ward, Char Common, PA-C  clopidogrel  (PLAVIX ) 75 MG tablet Take 1 tablet (75 mg total) by mouth daily at 6 (six) AM. 08/04/23   Watson Hacking, MD  [facility] ASPIRIN  LOW DOSE 81 MG tablet TAKE 1 TABLET (81 MG TOTAL) BY MOUTH DAILY. SWALLOW WHOLE. 06/01/23   Rhyne, Samantha J, PA-C  doxazosin  (CARDURA ) 4 MG tablet Take 1 tablet (4 mg total) by mouth daily. 08/04/23   Watson Hacking, MD  finasteride  (PROSCAR ) 5 MG tablet Take 1 tablet (5 mg total) by mouth daily. 08/04/23   Lalonde, John C, MD  lactose free nutrition (BOOST) LIQD Take 237 mLs by mouth daily.    [provider]  lisinopril -hydrochlorothiazide  (ZESTORETIC ) 10-12.5 MG tablet Take 1 tablet by mouth daily. 08/04/23   Watson Hacking, MD  rosuvastatin  (CRESTOR ) 10 MG tablet Take 1 tablet (10 mg total) by mouth daily. 08/04/23   Robina Chol,  Theadora Fines, MD    Family History Family History  Problem Relation Age of Onset   Hypertension Father     Social History Social History   Tobacco Use   Smoking status: Never    Passive exposure: Never   Smokeless tobacco: Never  Vaping Use   Vaping status: Never Used  Substance Use Topics   Alcohol use: No    Comment: rare   Drug use: Yes    Types: Marijuana     Allergies   Patient has no known allergies.   Review of Systems Review of Systems  Constitutional:  Negative for chills and fever.  HENT:   Negative for ear pain and sore throat.   Eyes:  Negative for pain and visual disturbance.  Respiratory:  Negative for cough and shortness of breath.   Cardiovascular:  Negative for chest pain and palpitations.  Gastrointestinal:  Negative for abdominal pain and vomiting.  Genitourinary:  Positive for hematuria. Negative for dysuria.  Musculoskeletal:  Negative for arthralgias and back pain.  Skin:  Negative for color change and rash.  Neurological:  Negative for seizures and syncope.  All other systems reviewed and are negative.    Physical Exam Triage Vital Signs ED Triage Vitals  Encounter Vitals Group     BP 09/05/23 1104 123/73     Systolic BP Percentile --      Diastolic BP Percentile --      Pulse Rate 09/05/23 1104 71     Resp 09/05/23 1104 16     Temp 09/05/23 1104 97.9 F (36.6 C)     Temp Source 09/05/23 1104 Oral     SpO2 09/05/23 1104 93 %     Weight --      Height --      Head Circumference --      Peak Flow --      Pain Score 09/05/23 1103 0     Pain Loc --      Pain Education --      Exclude from Growth Chart --    No data found.  Updated Vital Signs BP 123/73 (BP Location: Right Arm)   Pulse 71   Temp 97.9 F (36.6 C) (Oral)   Resp 16   SpO2 93%   Visual Acuity Right Eye Distance:   Left Eye Distance:   Bilateral Distance:    Right Eye Near:   Left Eye Near:    Bilateral Near:     Physical Exam Vitals and nursing note reviewed.  Constitutional:      General: He is not in acute distress.    Appearance: He is well-developed.  HENT:     Head: Normocephalic and atraumatic.  Eyes:     Conjunctiva/sclera: Conjunctivae normal.  Cardiovascular:     Rate and Rhythm: Normal rate and regular rhythm.     Heart sounds: No murmur heard. Pulmonary:     Effort: Pulmonary effort is normal. No respiratory distress.     Breath sounds: Normal breath sounds.  Abdominal:     Palpations: Abdomen is soft.     Tenderness: There is no abdominal tenderness.   Musculoskeletal:        General: No swelling.     Cervical back: Neck supple.  Skin:    General: Skin is warm and dry.     Capillary Refill: Capillary refill takes less than 2 seconds.  Neurological:     Mental Status: He is alert.  Psychiatric:  Mood and Affect: Mood normal.      UC Treatments / Results  Labs (all labs ordered are listed, but only abnormal results are displayed) Labs Reviewed  POCT URINALYSIS DIP (MANUAL ENTRY) - Abnormal; Notable for the following components:      Result Value   Color, UA red (*)    Clarity, UA cloudy (*)    Glucose, UA =100 (*)    Bilirubin, UA large (*)    Ketones, POC UA small (15) (*)    Blood, UA large (*)    Protein Ur, POC >=300 (*)    Urobilinogen, UA 4.0 (*)    Leukocytes, UA Large (3+) (*)    All other components within normal limits  URINE CULTURE    EKG   Radiology No results found.  Procedures Procedures (including critical care time)  Medications Ordered in UC Medications - No data to display  Initial Impression / Assessment and Plan / UC Course  I have reviewed the triage vital signs and the nursing notes.  Pertinent labs & imaging results that were available during my care of the patient were reviewed by me and considered in my medical decision making (see chart for details).     Hematuria, will treat for UTI.  Advised urology follow-up.  Advised if continued with hematuria to contact Dr. Reola Casino about holding Plavix .  At this time patient with normal vitals, no dysuria, abdominal pain, fever, chills. Will send out urine culture.  Final Clinical Impressions(s) / UC Diagnoses   Final diagnoses:  Urinary tract infection with hematuria, site unspecified     Discharge Instructions      Start antibiotic as prescribed Drink plenty of fluids If bleeding continues please follow up with urology listed above and contact Dr. Vikki Graves about holding Plavix    ED Prescriptions     Medication Sig Dispense Auth.  Provider   sulfamethoxazole-trimethoprim (BACTRIM DS) 800-160 MG tablet Take 1 tablet by mouth 2 (two) times daily for 7 days. 14 tablet Ward, Sajad Glander Z, PA-C      PDMP not reviewed this encounter.   Ward, Char Common, PA-C 09/05/23 1155    Ward, Char Common, PA-C 09/05/23 1156

## 2023-09-05 NOTE — Discharge Instructions (Addendum)
 Start antibiotic as prescribed Drink plenty of fluids If bleeding continues please follow up with urology listed above and contact Dr. Vikki Graves about holding Plavix 

## 2023-09-06 LAB — URINE CULTURE

## 2023-09-08 ENCOUNTER — Telehealth: Payer: Self-pay

## 2023-09-08 NOTE — Telephone Encounter (Signed)
 Pt recently started Bactrim for UTI. He was told by provider he saw at urgent care to check with us  if he can continue on Plavix  during this course of antibiotics. Per pharmacist, there is no listed drug-drug interaction. Pt is aware and has no further questions/concerns at this time.

## 2023-09-09 ENCOUNTER — Encounter: Payer: Self-pay | Admitting: Family Medicine

## 2023-09-09 ENCOUNTER — Ambulatory Visit: Admitting: Family Medicine

## 2023-09-09 VITALS — BP 116/80 | HR 97 | Wt 187.0 lb

## 2023-09-09 DIAGNOSIS — R310 Gross hematuria: Secondary | ICD-10-CM | POA: Diagnosis not present

## 2023-09-09 LAB — POCT URINALYSIS DIP (CLINITEK)
Glucose, UA: 100 mg/dL — AB
Nitrite, UA: POSITIVE — AB
POC PROTEIN,UA: >=300 — AB
Spec Grav, UA: 1.01 (ref 1.010–1.025)
Urobilinogen, UA: >=8 U/dL — AB
pH, UA: 7.5 (ref 5.0–8.0)

## 2023-09-09 NOTE — Progress Notes (Signed)
   Subjective:    Patient ID: YONIC GUYMON, male    DOB: 11-07-1940, 83 y.o.   MRN: [id number]  HPI He is here for evaluation of hematuria.  Apparently this started Saturday when he saw frank blood in his urine.  There was no discharge, dysuria, fever, chills, lower abdominal or flank pain.  He was seen in an urgent care center and apparently a culture was taken which was indeterminate.  They then placed him on Septra.  He has been on that for approximately 4 days and still have difficulty with blood in his urine.   Review of Systems     Objective:    Physical Exam Alert and in no distress otherwise not examined.  Urine dipstick showed frank blood.       Assessment & Plan:  Gross hematuria - Plan: Ambulatory referral to Urology, POCT URINALYSIS DIP (CLINITEK)  I explained that at this time go ahead and continue the antibiotic to see if there will be any change in his hematuria.  If it goes away then we can say that it probably was a infection that caused this.  If not then I will go ahead and refer to urology to get this further evaluated.  He was comfortable with that.

## 2023-09-09 NOTE — Patient Instructions (Signed)
 Stay on the antibiotic and if you start having any other symptoms before you see the urologist let me know

## 2023-09-10 ENCOUNTER — Telehealth: Payer: Self-pay

## 2023-09-10 ENCOUNTER — Ambulatory Visit: Admitting: Urology

## 2023-09-10 ENCOUNTER — Encounter: Payer: Self-pay | Admitting: Urology

## 2023-09-10 VITALS — BP 102/64 | HR 116 | Ht 73.0 in | Wt 185.0 lb

## 2023-09-10 DIAGNOSIS — R310 Gross hematuria: Secondary | ICD-10-CM | POA: Diagnosis not present

## 2023-09-10 LAB — BLADDER SCAN AMB NON-IMAGING: Scan Result: 9

## 2023-09-10 LAB — URINALYSIS, ROUTINE W REFLEX MICROSCOPIC: Specific Gravity, UA: 1.01 (ref 1.005–1.030)

## 2023-09-10 LAB — MICROSCOPIC EXAMINATION
Bacteria, UA: NONE SEEN
RBC, Urine: >30 /HPF — AB (ref 0–2)

## 2023-09-10 NOTE — Telephone Encounter (Signed)
 Triage: -Dr. Trent Frizzle called stating pt was having hematuria and wanted to see if Dr. Vikki Graves would consider stopping the Plavix .  -Consulted with Dr. Vikki Graves and order received to stop the Plavix  at this time.   -Pt notified and will discuss the future of taking Plavix  during his follow-ups

## 2023-09-10 NOTE — Progress Notes (Signed)
 Chief Complaint:  Chief Complaint  Patient presents with   Hematuria    History of Present Illness:  Jacob Bowen is a 83 y.o. male who is seen in consultation from Watson Hacking, MD for evaluation of gross hematuria.  This started about 5 days ago.  Gross painless hematuria present, some clots passed.  He has had persistent gross hematuria since that time.  He is on Plavix  for arterial stents.  He has had no abdominal or flank pain.  No fever, no chills.  No dysuria.  He is on finasteride  for a large prostate.  He has had a high PSA, but stable at about 5.  He is a non-smoker   Past Medical History:  Past Medical History:  Diagnosis Date   AAA (abdominal aortic aneurysm) (HCC)    Femoral hernia, bilateral, s/p lap repair 04/13/2012   Hemorrhoids    EXTERNAL   High cholesterol    Hypertension     Past Surgical History:  Past Surgical History:  Procedure Laterality Date   ABDOMINAL AORTIC ENDOVASCULAR STENT GRAFT N/A 07/03/2022   Procedure: ABDOMINAL AORTIC ENDOVASCULAR STENT GRAFT;  Surgeon: Adine Hoof, MD;  Location: [facility] OR;  Service: Vascular;  Laterality: N/A;   COLONOSCOPY     COLONOSCOPY W/ POLYPECTOMY     INGUINAL HERNIA REPAIR Right    INSERTION OF ILIAC STENT Bilateral 07/03/2022   Procedure: INSERTION OF ILIAC STENTS;  Surgeon: Adine Hoof, MD;  Location: [facility] OR;  Service: Vascular;  Laterality: Bilateral;   PATCH ANGIOPLASTY Right 07/03/2022   Procedure: PATCH ANGIOPLASTY RIGHT COMMON FEMORAL ARTERY;  Surgeon: Adine Hoof, MD;  Location: [facility] OR;  Service: Vascular;  Laterality: Right;   THROMBECTOMY FEMORAL ARTERY Right 07/03/2022   Procedure: THROMBECTOMY FEMORAL ARTERY;  Surgeon: Adine Hoof, MD;  Location: [facility] OR;  Service: Vascular;  Laterality: Right;   ULTRASOUND GUIDANCE FOR VASCULAR ACCESS Bilateral 07/03/2022   Procedure: ULTRASOUND GUIDANCE FOR VASCULAR ACCESS;  Surgeon: Adine Hoof, MD;   Location: [facility] OR;  Service: Vascular;  Laterality: Bilateral;    Allergies:  No Known Allergies  Family History:  Family History  Problem Relation Age of Onset   Hypertension Father     Social History:  Social History   Tobacco Use   Smoking status: Never    Passive exposure: Never   Smokeless tobacco: Never  Vaping Use   Vaping status: Never Used  Substance Use Topics   Alcohol use: No    Comment: rare   Drug use: Yes    Types: Marijuana    Review of symptoms:  Constitutional:  Negative for unexplained weight loss, night sweats, fever, chills ENT:  Negative for nose bleeds, sinus pain, painful swallowing CV:  Negative for chest pain, shortness of breath, exercise intolerance, palpitations, loss of consciousness Resp:  Negative for cough, wheezing, shortness of breath GI:  Negative for nausea, vomiting, diarrhea, bloody stools GU:  Positives noted in HPI Neuro:  Negative for seizures, poor balance, limb weakness, slurred speech Psych:  Negative for lack of energy, depression, anxiety Endocrine:  Negative for polydipsia, polyuria, symptoms of hypoglycemia (dizziness, hunger, sweating) Hematologic:  Negative for anemia, purpura, petechia, prolonged or excessive bleeding, use of anticoagulants  Allergic:  Negative for difficulty breathing or choking as a result of exposure to anything; no shellfish allergy; no allergic response (rash/itch) to materials, foods  Physical exam: There were no vitals taken for this visit. GENERAL APPEARANCE:  Well appearing, well developed,  well nourished, NAD HEENT: Atraumatic, Normocephalic. NECK: Normal appearance LUNGS: Normal inspiratory and expiratory excursion HEART: Regular Rate ABDOMEN: Flat, soft, nontender, nondistended.  No CVA tenderness GU: Phallus normal, no lesions. Scrotal skin normal. Testicles/epididymal structures normal. Meatus normal. Normal anal sphincter tone, prostate 60 mL, symmetric, non nodular, non  tender. EXTREMITIES: Moves all extremities well.  Without clubbing, cyanosis, or edema. NEUROLOGIC:  Alert and oriented x 3, normal gait, CN II-XII grossly intact.  MENTAL STATUS:  Appropriate. SKIN:  Warm, dry and intact.    Results: Results for orders placed or performed in visit on 09/09/23 (from the past 24 hours)  POCT URINALYSIS DIP (CLINITEK)   Collection Time: 09/09/23  2:45 PM  Result Value Ref Range   Color, UA red (A) yellow   Clarity, UA cloudy (A) clear   Glucose, UA =100 (A) negative mg/dL   Bilirubin, UA large (A) negative   Ketones, POC UA large (80) (A) negative mg/dL   Spec Grav, UA 1.610 9.604 - 1.025   Blood, UA large (A) negative   pH, UA 7.5 5.0 - 8.0   POC PROTEIN,UA >=300 (A) negative, trace   Urobilinogen, UA >=8.0 (A) 0.2 or 1.0 E.U./dL   Nitrite, UA Positive (A) Negative   Leukocytes, UA Large (3+) (A) Negative    I have reviewed referring/prior physicians notes--vascular, PCP notes  I have reviewed prior urinalyses  I have reviewed PSA results  I have reviewed prior imagingprior CT scan reviewed.  Estimated prostate volume 60 mL  I have reviewed urine culture results  Bladder scan volume today 10 ml  Assessment: Gross hematuria, patient emptying out well.  He does not have a history of high risk behavior but does have a large prostate and is on Plavix    Plan: I left a message with vein and vascular surgery to see if the patient can come off his Plavix   He will continue his sulfa until it has run out  He will call on Monday to give us  an update on his urination  We will eventually need to proceed with CT hematuria protocol as well as cystoscopy

## 2023-09-14 ENCOUNTER — Other Ambulatory Visit: Payer: Self-pay | Admitting: Urology

## 2023-09-14 ENCOUNTER — Telehealth: Payer: Self-pay | Admitting: Urology

## 2023-09-14 DIAGNOSIS — R310 Gross hematuria: Secondary | ICD-10-CM

## 2023-09-14 NOTE — Telephone Encounter (Signed)
**Note De-identified  [place] Obfuscation** Please advise 

## 2023-09-14 NOTE — Telephone Encounter (Signed)
 Patient called to make you aware that his urine has cleared up. He also wants to know if you want him to quit taking Plavix  all together or just temporary. Please Advise.

## 2023-09-14 NOTE — Telephone Encounter (Signed)
 Do I need to send off for clearance for the Plavix ?

## 2023-09-18 ENCOUNTER — Ambulatory Visit (HOSPITAL_BASED_OUTPATIENT_CLINIC_OR_DEPARTMENT_OTHER)
Admission: RE | Admit: 2023-09-18 | Discharge: 2023-09-18 | Disposition: A | Discharge status: Home / Self Care | Source: Ambulatory Visit | Attending: Urology | Admitting: Urology

## 2023-09-18 DIAGNOSIS — K8020 Calculus of gallbladder without cholecystitis without obstruction: Secondary | ICD-10-CM | POA: Diagnosis not present

## 2023-09-18 DIAGNOSIS — K449 Diaphragmatic hernia without obstruction or gangrene: Secondary | ICD-10-CM | POA: Diagnosis not present

## 2023-09-18 DIAGNOSIS — R310 Gross hematuria: Secondary | ICD-10-CM | POA: Insufficient documentation

## 2023-09-18 DIAGNOSIS — K5730 Diverticulosis of large intestine without perforation or abscess without bleeding: Secondary | ICD-10-CM | POA: Diagnosis not present

## 2023-09-18 MED ORDER — IOHEXOL 300 MG/ML  SOLN
125.0000 mL | Freq: Once | INTRAMUSCULAR | Status: AC | PRN
  Administered 2023-09-18: 125 mL via INTRAVENOUS

## 2023-10-02 ENCOUNTER — Ambulatory Visit: Payer: Self-pay | Admitting: Urology

## 2023-10-02 ENCOUNTER — Other Ambulatory Visit: Payer: Self-pay | Admitting: *Deleted

## 2023-10-02 DIAGNOSIS — Z8679 Personal history of other diseases of the circulatory system: Secondary | ICD-10-CM

## 2023-10-02 MED ORDER — CLOPIDOGREL BISULFATE 75 MG PO TABS
75.0000 mg | ORAL_TABLET | Freq: Every day | ORAL | 3 refills | Status: AC
Start: 2023-10-02 — End: ?

## 2023-10-08 DIAGNOSIS — Z961 Presence of intraocular lens: Secondary | ICD-10-CM | POA: Diagnosis not present

## 2023-10-14 NOTE — Progress Notes (Signed)
 Assessment: Gross hematuria, resolved.  No significant pathology on CT but he does have a large prostate with an intravesical median lobe  BPH, emptying out fairly well, on dual medical therapy   Plan: - At this point I reassured him that I do not think any further management needed for his BPH or hematuria  -Continue finasteride , Cardura   -Office visit in a year   History of Present Illness:  5.8.2025: Initial visit for evaluation of gross hematuria.  This started about 5 days ago.  Gross painless hematuria present, some clots passed.  He has had persistent gross hematuria since that time.  He is on Plavix  for arterial stents.  He has had no abdominal or flank pain.  No fever, no chills.  No dysuria.  He is on finasteride  for a large prostate.  He has had a high PSA, but stable at about 5.  He is a non-smoker  6.16.2025: Here for follow-up with cystoscopy.  CT hematuria workup performed about a month ago and revealed:  No radiographic evidence of urinary tract neoplasm, urolithiasis, or hydronephrosis.   Stable mildly enlarged prostate, with median lobe hypertrophy indenting the bladder base.   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   Cholelithiasis. No radiographic evidence of cholecystitis.   Colonic diverticulosis, without radiographic evidence of diverticulitis.   Small hiatal hernia.   Prior stent graft repair of abdominal aortic aneurysm. Stable native aneurysm sac measuring 5.6 cm.    Past Medical History:  Past Medical History:  Diagnosis Date   AAA (abdominal aortic aneurysm) (HCC)    Femoral hernia, bilateral, s/p lap repair 04/13/2012   Hemorrhoids    EXTERNAL   High cholesterol    Hypertension     Past Surgical History:  Past Surgical History:  Procedure Laterality Date   ABDOMINAL AORTIC ENDOVASCULAR STENT GRAFT N/A 07/03/2022    Procedure: ABDOMINAL AORTIC ENDOVASCULAR STENT GRAFT;  Surgeon: Adine Hoof, MD;  Location: [facility] OR;  Service: Vascular;  Laterality: N/A;   COLONOSCOPY     COLONOSCOPY W/ POLYPECTOMY     INGUINAL HERNIA REPAIR Right    INSERTION OF ILIAC STENT Bilateral 07/03/2022   Procedure: INSERTION OF ILIAC STENTS;  Surgeon: Adine Hoof, MD;  Location: [facility] OR;  Service: Vascular;  Laterality: Bilateral;   PATCH ANGIOPLASTY Right 07/03/2022   Procedure: PATCH ANGIOPLASTY RIGHT COMMON FEMORAL ARTERY;  Surgeon: Adine Hoof, MD;  Location: [facility] OR;  Service: Vascular;  Laterality: Right;   THROMBECTOMY FEMORAL ARTERY Right 07/03/2022   Procedure: THROMBECTOMY FEMORAL ARTERY;  Surgeon: Adine Hoof, MD;  Location: [facility] OR;  Service: Vascular;  Laterality: Right;   ULTRASOUND GUIDANCE FOR VASCULAR ACCESS Bilateral 07/03/2022   Procedure: ULTRASOUND GUIDANCE FOR VASCULAR ACCESS;  Surgeon: Adine Hoof, MD;  Location: [facility] OR;  Service: Vascular;  Laterality: Bilateral;    Allergies:  No Known Allergies  Family History:  Family History  Problem Relation Age of Onset   Hypertension Father     Social History:  Social History   Tobacco Use   Smoking status: Never    Passive exposure: Never   Smokeless tobacco: Never  Vaping Use   Vaping status: Never Used  Substance Use Topics   Alcohol use: No    Comment: rare   Drug use: Yes    Types: Marijuana    Review of symptoms:  Constitutional:  Negative for unexplained weight loss, night  sweats, fever, chills ENT:  Negative for nose bleeds, sinus pain, painful swallowing CV:  Negative for chest pain, shortness of breath, exercise intolerance, palpitations, loss of consciousness Resp:  Negative for cough, wheezing, shortness of breath GI:  Negative for nausea, vomiting, diarrhea, bloody stools GU:  Positives noted in HPI Neuro:  Negative for seizures, poor balance, limb weakness, slurred  speech Psych:  Negative for lack of energy, depression, anxiety Endocrine:  Negative for polydipsia, polyuria, symptoms of hypoglycemia (dizziness, hunger, sweating) Hematologic:  Negative for anemia, purpura, petechia, prolonged or excessive bleeding, use of anticoagulants  Allergic:  Negative for difficulty breathing or choking as a result of exposure to anything; no shellfish allergy; no allergic response (rash/itch) to materials, foods  Physical exam: There were no vitals taken for this visit. GENERAL APPEARANCE:  Well appearing, well developed, well nourished, NAD HEENT: Atraumatic, Normocephalic. NECK: Normal appearance LUNGS: Normal inspiratory and expiratory excursion HEART: Regular Rate ABDOMEN: Flat, soft, nontender, nondistended.  No CVA tenderness GU: Phallus normal, no lesions. Scrotal skin normal. Testicles/epididymal structures normal. Meatus normal. Normal anal sphincter tone, prostate 60 mL, symmetric, non nodular, non tender. EXTREMITIES: Moves all extremities well.  Without clubbing, cyanosis, or edema. NEUROLOGIC:  Alert and oriented x 3, normal gait, CN II-XII grossly intact.  MENTAL STATUS:  Appropriate. SKIN:  Warm, dry and intact.    Results: No results found for this or any previous visit (from the past 24 hours).   Cystoscopy Procedure Note:  Indication: Gross hematuria  After informed consent and discussion of the procedure and its risks, Jacob Bowen was positioned and prepped in the standard fashion.  Cystoscopy was performed with a flexible cystoscope.   Findings: Urethra: No stricture or lesion Prostate: Trilobar hypertrophy with obstruction, intravesical median lobe Bladder neck: Open Ureteral orifices: Normal bilaterally Bladder: 2+ trabeculations, a few saccules, several diverticula.  Most are wide mouth, 2 with small max or hard/impossible to scope  The patient tolerated the procedure well.

## 2023-10-19 ENCOUNTER — Ambulatory Visit: Admitting: Urology

## 2023-10-19 VITALS — BP 137/82 | HR 112 | Ht 74.0 in | Wt 191.0 lb

## 2023-10-19 DIAGNOSIS — Z87898 Personal history of other specified conditions: Secondary | ICD-10-CM | POA: Diagnosis not present

## 2023-10-19 DIAGNOSIS — N400 Enlarged prostate without lower urinary tract symptoms: Secondary | ICD-10-CM | POA: Diagnosis not present

## 2023-10-19 DIAGNOSIS — R310 Gross hematuria: Secondary | ICD-10-CM | POA: Diagnosis not present

## 2023-10-19 LAB — URINALYSIS, ROUTINE W REFLEX MICROSCOPIC
Bilirubin, UA: NEGATIVE
Glucose, UA: NEGATIVE
Ketones, UA: NEGATIVE
Leukocytes,UA: NEGATIVE
Nitrite, UA: NEGATIVE
Protein,UA: NEGATIVE
Specific Gravity, UA: 1.01 (ref 1.005–1.030)
Urobilinogen, Ur: 1 mg/dL (ref 0.2–1.0)
pH, UA: 7 (ref 5.0–7.5)

## 2023-10-19 LAB — MICROSCOPIC EXAMINATION: Bacteria, UA: NONE SEEN

## 2023-10-19 MED ORDER — CIPROFLOXACIN HCL 500 MG PO TABS
500.0000 mg | ORAL_TABLET | Freq: Once | ORAL | Status: AC
  Administered 2023-10-19: 500 mg via ORAL

## 2023-11-03 ENCOUNTER — Ambulatory Visit: Payer: Self-pay

## 2023-11-03 NOTE — Telephone Encounter (Signed)
 FYI Only or Action Required?: FYI only for provider.  Patient was last seen in primary care on 09/09/2023 by Joyce Norleen BROCKS, MD. Called Nurse Triage reporting Hypertension. Symptoms began several weeks ago. Interventions attempted: Prescription medications: lisinopril -hctz. Symptoms are: hypertension, intermittent chest pains last 2-3 seconds occurs weekly to monthly. Blood pressure gradually worsening, chest pain unchanged.  Triage Disposition: See PCP Within 2 Weeks  Patient/caregiver understands and will follow disposition?: Yes                       Summary: Blood Pressure   Reason for Triage: Patient states his BP is 151/90 and his pulse is 97. Would like some advice on what he should do, or if he needs to come in for a visit. States he has had it under control for years, until he had an issue with blood in his urination which he seen a urologist for. In the last week or two it has been higher than normal and has not gone back down near his normal numbers (135/70)     Reason for Disposition  [1] Systolic BP  >= 130 OR Diastolic >= 80 AND [2] taking BP medications  Answer Assessment - Initial Assessment Questions Patient states his baseline SBP is 120 so he is concerned. Patient states back in early May he was experiencing gross hematuria and was referred to urology. Patient states his BP has been elevated 1-2 weeks.  1. BLOOD PRESSURE: What is the blood pressure? Did you take at least two measurements 5 minutes apart?     151/91, pulse 97.  2. ONSET: When did you take your blood pressure?     About an hour ago this morning.  3. HOW: How did you take your blood pressure? (e.g., automatic home BP monitor, visiting nurse)     Automatic BP monitor/cuff at drug store.  4. HISTORY: Do you have a history of high blood pressure?     Yes.  5. MEDICINES: Are you taking any medicines for blood pressure? Have you missed any doses recently?      Lisinopril -hydrochlorothiazide , daily. He states he has not missed any doses.  6. OTHER SYMPTOMS: Do you have any symptoms? (e.g., blurred vision, chest pain, difficulty breathing, headache, weakness)     Patient denies difficulty breathing, changes in speech or vision, unilateral numbness or weakness, headaches. Patient states intermittent pain bilateral chest/breasts(that muscle right there above your nipple) describes it as muscular pain that lasts 2-3 seconds sporadic can happen once a week or go 2 months without it. Patient states his hematuria has resolved completely.  7. PREGNANCY: Is there any chance you are pregnant? When was your last menstrual period?     N/A.  Protocols used: Blood Pressure - High-A-AH

## 2023-11-12 ENCOUNTER — Ambulatory Visit: Admitting: Family Medicine

## 2023-12-15 ENCOUNTER — Ambulatory Visit: Admitting: Family Medicine

## 2024-02-04 ENCOUNTER — Telehealth: Payer: Self-pay

## 2024-02-04 NOTE — Telephone Encounter (Signed)
 Pt called stating he starting having blood in his urine again last night. Pt stated the bleeding was light red and even almost pink each time he urinated yesterday. Pt states this morning the bleeding has now turned to dark red. Pt is still on plavix . Pt stated that he was instructed to call again if bleeding returns. Please advise.

## 2024-02-04 NOTE — Telephone Encounter (Signed)
 Triage / Medication:  -pt called to report reoccurrence of hematuria.  He stated his urologist wanted him to call Dr. Sheree argue ask about stopping the Plavix .   -pt recalls same conversation from April and states he stopped the Plavix  for about a week/8 days and restarted it - not having a problem until now. -discussed w/Dr. Sheree and again advised to stop the Plavix . -advised pt to stop Plavix  and do not restart.   -pt did say he would restart the baby ASA though

## 2024-02-09 NOTE — Telephone Encounter (Signed)
 Spoke with pt in reference to Plavix  and gross hematuria. Pt stated that he spoke with the physician that started him on Plavix  and they advised him to stop the Plavix  completely. Pt stated since stopping Plavix , gross hematuria has stopped.

## 2024-03-22 ENCOUNTER — Other Ambulatory Visit (HOSPITAL_BASED_OUTPATIENT_CLINIC_OR_DEPARTMENT_OTHER): Payer: Self-pay

## 2024-03-22 MED ORDER — FLUZONE HIGH-DOSE 0.5 ML IM SUSY
0.5000 mL | PREFILLED_SYRINGE | Freq: Once | INTRAMUSCULAR | 0 refills | Status: AC
  Filled 2024-03-22: qty 0.5, 1d supply, fill #0

## 2024-03-22 MED ORDER — COMIRNATY 30 MCG/0.3ML IM SUSY
0.3000 mL | PREFILLED_SYRINGE | Freq: Once | INTRAMUSCULAR | 0 refills | Status: AC
  Filled 2024-03-22: qty 0.3, 1d supply, fill #0

## 2024-08-02 ENCOUNTER — Ambulatory Visit

## 2024-08-04 ENCOUNTER — Ambulatory Visit: Payer: Self-pay | Admitting: Family Medicine
